# Patient Record
Sex: Female | Born: 1950 | Race: White | Hispanic: No | Smoking: Former smoker
Health system: Southern US, Community
[De-identification: ages and names within clinical notes are randomized; demographics above are authoritative.]

## PROBLEM LIST (undated history)

## (undated) DIAGNOSIS — M722 Plantar fascial fibromatosis: Secondary | ICD-10-CM

## (undated) DIAGNOSIS — M1990 Unspecified osteoarthritis, unspecified site: Secondary | ICD-10-CM

## (undated) DIAGNOSIS — E785 Hyperlipidemia, unspecified: Secondary | ICD-10-CM

## (undated) DIAGNOSIS — N200 Calculus of kidney: Secondary | ICD-10-CM

## (undated) DIAGNOSIS — I10 Essential (primary) hypertension: Secondary | ICD-10-CM

## (undated) DIAGNOSIS — T7840XA Allergy, unspecified, initial encounter: Secondary | ICD-10-CM

## (undated) HISTORY — PX: MOLE REMOVAL: SHX2046

## (undated) HISTORY — DX: Hyperlipidemia, unspecified: E78.5

## (undated) HISTORY — DX: Essential (primary) hypertension: I10

## (undated) HISTORY — DX: Calculus of kidney: N20.0

## (undated) HISTORY — PX: TONSILLECTOMY: SUR1361

## (undated) HISTORY — DX: Plantar fascial fibromatosis: M72.2

## (undated) HISTORY — DX: Unspecified osteoarthritis, unspecified site: M19.90

## (undated) HISTORY — PX: BREAST CYST ASPIRATION: SHX578

## (undated) HISTORY — DX: Allergy, unspecified, initial encounter: T78.40XA

## (undated) HISTORY — PX: CHOLECYSTECTOMY: SHX55

---

## 2015-03-11 ENCOUNTER — Encounter: Payer: Self-pay | Admitting: Pediatrics

## 2016-08-09 DIAGNOSIS — Z23 Encounter for immunization: Secondary | ICD-10-CM | POA: Diagnosis not present

## 2016-10-05 ENCOUNTER — Encounter: Payer: Self-pay | Admitting: Pediatrics

## 2016-10-05 ENCOUNTER — Ambulatory Visit (INDEPENDENT_AMBULATORY_CARE_PROVIDER_SITE_OTHER): Payer: Medicare Other | Admitting: Pediatrics

## 2016-10-05 VITALS — BP 159/86 | HR 71 | Temp 97.3°F | Ht 66.0 in | Wt 189.4 lb

## 2016-10-05 DIAGNOSIS — R739 Hyperglycemia, unspecified: Secondary | ICD-10-CM | POA: Diagnosis not present

## 2016-10-05 DIAGNOSIS — Z1211 Encounter for screening for malignant neoplasm of colon: Secondary | ICD-10-CM | POA: Diagnosis not present

## 2016-10-05 DIAGNOSIS — Z23 Encounter for immunization: Secondary | ICD-10-CM

## 2016-10-05 DIAGNOSIS — R030 Elevated blood-pressure reading, without diagnosis of hypertension: Secondary | ICD-10-CM | POA: Diagnosis not present

## 2016-10-05 DIAGNOSIS — E785 Hyperlipidemia, unspecified: Secondary | ICD-10-CM

## 2016-10-05 DIAGNOSIS — Z1159 Encounter for screening for other viral diseases: Secondary | ICD-10-CM

## 2016-10-05 DIAGNOSIS — Z1231 Encounter for screening mammogram for malignant neoplasm of breast: Secondary | ICD-10-CM

## 2016-10-05 DIAGNOSIS — Z1239 Encounter for other screening for malignant neoplasm of breast: Secondary | ICD-10-CM

## 2016-10-05 LAB — BAYER DCA HB A1C WAIVED: HB A1C (BAYER DCA - WAIVED): 5.8 % (ref <7.0)

## 2016-10-05 NOTE — Progress Notes (Signed)
Subjective:   Patient ID: Anita Kennedy, female    DOB: 31-Jan-1951, 65 y.o.   MRN: [id number] CC: New Patient (Initial Visit) follow up multiple med problems HPI: Anita Kennedy is a 65 y.o. female presenting for New Patient (Initial Visit)  Recently moved here from [place] Has children in [facility] and still in [place] No acute complaints today  Colonoscopy: about ten years ago, due Mammogram, last was normal over two years ago, told she had dense breast tissue, due Pap smear, no abnormals, was regularly getting them down  BP: says usually blood pressure at home in 120s-130s, always higher when in doctors offices  Past Medical History:  Diagnosis Date  . Arthritis   . Kidney stones   . Plantar fasciitis of right foot    Family History  Problem Relation Age of Onset  . Alcohol abuse Father   . Early death Father   . Heart disease Father   . Hyperlipidemia Father   . Hypertension Father   . Cancer Maternal Grandmother   . Depression Maternal Grandmother    Social History   Social History  . Marital status: Divorced    Spouse name: N/A  . Number of children: N/A  . Years of education: N/A   Social History Main Topics  . Smoking status: Former Smoker    Types: Cigarettes    Quit date: 10/05/2013  . Smokeless tobacco: Never Used  . Alcohol use No  . Drug use: No  . Sexual activity: Not Asked   Other Topics Concern  . None   Social History Narrative  . None   ROS: All systems negative other than what is in HPI  Objective:    BP (!) 159/86   Pulse 71   Temp 97.3 F (36.3 C) (Oral)   Ht '5\' 6"'  (1.676 m)   Wt 189 lb 6.4 oz (85.9 kg)   BMI 30.57 kg/m   Wt Readings from Last 3 Encounters:  10/05/16 189 lb 6.4 oz (85.9 kg)    Gen: NAD, alert, cooperative with exam, NCAT EYES: EOMI, no conjunctival injection, or no icterus ENT:  TMs pearly gray b/l, OP without erythema LYMPH: no cervical LAD CV: NRRR, normal S1/S2, no murmur, distal pulses 2+ b/l Resp: CTABL, no  wheezes, normal WOB Abd: +BS, soft, NTND. no guarding or organomegaly Ext: No edema, warm Neuro: Alert and oriented MSK: normal muscle bulk  Assessment & Plan:  [name] was seen today for new patient (initial visit), follow up multiple med problems.  Diagnoses and all orders for this visit:  Breast cancer screening -     MM Digital Screening; Future  Elevated blood pressure reading Check at home Bring numbers back to clinic, let me know if regularly over 140/90 -     CMP14+EGFR  Hyperlipidemia, unspecified hyperlipidemia type Not on statin now ASCVD 10 yr risk just over 96% with systolic in clinic, just over 7.5% with SBP of 131 pt says she had at home Does not want to start statin now Will check BPs, let me know if regularly elevated, will start medication -     Lipid panel  Hyperglycemia Last year A1c 5.7 -     Bayer DCA Hb A1c Waived  Need for hepatitis C screening test -     Hepatitis C antibody  Screen for colon cancer Last colonoscopy 10 yrs ago in [place], normal, due -     Ambulatory referral to Gastroenterology  Need for shingles vaccine -  Varicella-zoster vaccine subcutaneous  Need for prophylactic vaccination against Streptococcus pneumoniae (pneumococcus) -     Pneumococcal conjugate vaccine 13-valent  UTD Tdap, last 2011 Follow up plan: Return in about 1 year (around 10/05/2017). [name], MD [place]

## 2016-10-05 NOTE — Patient Instructions (Signed)
[facility] Appointment: [phone]  

## 2016-10-06 LAB — LIPID PANEL
CHOLESTEROL TOTAL: 217 mg/dL — AB (ref 100–199)
Chol/HDL Ratio: 3.6 ratio units (ref 0.0–4.4)
HDL: 61 mg/dL (ref >39)
LDL Calculated: 134 mg/dL — ABNORMAL HIGH (ref 0–99)
Triglycerides: 112 mg/dL (ref 0–149)
VLDL CHOLESTEROL CAL: 22 mg/dL (ref 5–40)

## 2016-10-06 LAB — HEPATITIS C ANTIBODY: Hep C Virus Ab: <0.1 s/co ratio (ref 0.0–0.9)

## 2016-10-06 LAB — CMP14+EGFR
ALK PHOS: 101 IU/L (ref 39–117)
ALT: 19 IU/L (ref 0–32)
AST: 17 IU/L (ref 0–40)
Albumin/Globulin Ratio: 1.7 (ref 1.2–2.2)
Albumin: 4.4 g/dL (ref 3.6–4.8)
BILIRUBIN TOTAL: 0.4 mg/dL (ref 0.0–1.2)
BUN/Creatinine Ratio: 28 (ref 12–28)
BUN: 21 mg/dL (ref 8–27)
CHLORIDE: 102 mmol/L (ref 96–106)
CO2: 23 mmol/L (ref 18–29)
Calcium: 9.4 mg/dL (ref 8.7–10.3)
Creatinine, Ser: 0.76 mg/dL (ref 0.57–1.00)
GFR calc Af Amer: 95 mL/min/{1.73_m2} (ref >59)
GFR calc non Af Amer: 82 mL/min/{1.73_m2} (ref >59)
GLUCOSE: 92 mg/dL (ref 65–99)
Globulin, Total: 2.6 g/dL (ref 1.5–4.5)
Potassium: 4.7 mmol/L (ref 3.5–5.2)
Sodium: 142 mmol/L (ref 134–144)
Total Protein: 7 g/dL (ref 6.0–8.5)

## 2016-10-20 ENCOUNTER — Ambulatory Visit
Admission: RE | Admit: 2016-10-20 | Discharge: 2016-10-20 | Disposition: A | Discharge status: Home / Self Care | Payer: Medicare Other | Source: Ambulatory Visit | Attending: Pediatrics | Admitting: Pediatrics

## 2016-10-20 DIAGNOSIS — Z1231 Encounter for screening mammogram for malignant neoplasm of breast: Secondary | ICD-10-CM | POA: Diagnosis not present

## 2016-10-20 DIAGNOSIS — Z1239 Encounter for other screening for malignant neoplasm of breast: Secondary | ICD-10-CM

## 2016-10-23 ENCOUNTER — Telehealth: Payer: Self-pay

## 2016-10-23 NOTE — Telephone Encounter (Signed)
Pt received triage letter from [facility]. Please call [phone]

## 2016-10-23 NOTE — Telephone Encounter (Signed)
See separate triage.  

## 2016-10-24 NOTE — Telephone Encounter (Signed)
Gastroenterology Pre-Procedure Review  Request Date: 10/23/2016 Requesting Physician: [name]  PATIENT REVIEW QUESTIONS: The patient responded to the following health history questions as indicated:    1. Diabetes Melitis: no 2. Joint replacements in the past 12 months: no 3. Major health problems in the past 3 months: no 4. Has an artificial valve or MVP: no 5. Has a defibrillator: no 6. Has been advised in past to take antibiotics in advance of a procedure like teeth cleaning: no 7. Family history of colon cancer: no  8. Alcohol Use: no 9. History of sleep apnea: no  10. History of coronary artery or other vascular stents placed within the last 12 months: no    MEDICATIONS & ALLERGIES:    Patient reports the following regarding taking any blood thinners:   Plavix? no Aspirin? no Coumadin? no Brilinta? no Xarelto? no Eliquis? no Pradaxa? no Savaysa? no Effient? no  Patient confirms/reports the following medications:  Current Outpatient Prescriptions  Medication Sig Dispense Refill  . clindamycin (CLEOCIN) 150 MG capsule      No current facility-administered medications for this visit.     Patient confirms/reports the following allergies:  Allergies  Allergen Reactions  . Pineapple Anaphylaxis  . Penicillins Hives  . Nickel Rash    No orders of the defined types were placed in this encounter.   AUTHORIZATION INFORMATION Primary Insurance:   ID #:  Group #:  Pre-Cert / Auth required: Pre-Cert / Auth #:   Secondary Insurance:   ID #:  Group #:  Pre-Cert / Auth required: Pre-Cert / Auth #:   SCHEDULE INFORMATION: Procedure has been scheduled as follows:  Date:  11/03/2016              Time:1:00 PM   Location: [facility] Short Stay  This Gastroenterology Pre-Precedure Review Form is being routed to the following provider(s): Barney Drain, MD

## 2016-10-26 ENCOUNTER — Other Ambulatory Visit: Payer: Self-pay

## 2016-10-26 DIAGNOSIS — Z1211 Encounter for screening for malignant neoplasm of colon: Secondary | ICD-10-CM

## 2016-10-26 MED ORDER — PEG 3350-KCL-NA BICARB-NACL 420 G PO SOLR: 4000.0000 mL | ORAL | 0 refills | Status: DC

## 2016-10-26 NOTE — Telephone Encounter (Signed)
MOVI PREP SPLIT DOSING, REGULAR BREAKFAST. CLEAR LIQUIDS AFTER 9 AM.  

## 2016-10-26 NOTE — Telephone Encounter (Signed)
Rx sent to the pharmacy and instructions faxed to pharmacy. LMOM for pt that the instructions were faxed to the pharmacy due to the holidays.

## 2016-10-27 NOTE — Telephone Encounter (Signed)
Left another VM that instructions were faxed to the pharmacy.

## 2016-11-03 ENCOUNTER — Ambulatory Visit (HOSPITAL_COMMUNITY)
Admission: RE | Admit: 2016-11-03 | Discharge: 2016-11-03 | Disposition: A | Discharge status: Home / Self Care | Payer: Medicare Other | Source: Ambulatory Visit | Attending: Gastroenterology | Admitting: Gastroenterology

## 2016-11-03 ENCOUNTER — Encounter (HOSPITAL_COMMUNITY): Payer: Self-pay | Admitting: *Deleted

## 2016-11-03 ENCOUNTER — Encounter (HOSPITAL_COMMUNITY)
Admission: RE | Disposition: A | Discharge status: Home / Self Care | Payer: Self-pay | Source: Ambulatory Visit | Attending: Gastroenterology

## 2016-11-03 DIAGNOSIS — Z1211 Encounter for screening for malignant neoplasm of colon: Secondary | ICD-10-CM

## 2016-11-03 DIAGNOSIS — Z1212 Encounter for screening for malignant neoplasm of rectum: Secondary | ICD-10-CM | POA: Diagnosis not present

## 2016-11-03 DIAGNOSIS — K648 Other hemorrhoids: Secondary | ICD-10-CM | POA: Diagnosis not present

## 2016-11-03 DIAGNOSIS — D123 Benign neoplasm of transverse colon: Secondary | ICD-10-CM

## 2016-11-03 DIAGNOSIS — Z87891 Personal history of nicotine dependence: Secondary | ICD-10-CM | POA: Diagnosis not present

## 2016-11-03 DIAGNOSIS — Z880 Allergy status to penicillin: Secondary | ICD-10-CM | POA: Diagnosis not present

## 2016-11-03 DIAGNOSIS — Z91018 Allergy to other foods: Secondary | ICD-10-CM | POA: Diagnosis not present

## 2016-11-03 DIAGNOSIS — Z888 Allergy status to other drugs, medicaments and biological substances status: Secondary | ICD-10-CM | POA: Insufficient documentation

## 2016-11-03 DIAGNOSIS — Q438 Other specified congenital malformations of intestine: Secondary | ICD-10-CM | POA: Insufficient documentation

## 2016-11-03 DIAGNOSIS — Z87442 Personal history of urinary calculi: Secondary | ICD-10-CM | POA: Insufficient documentation

## 2016-11-03 DIAGNOSIS — M1990 Unspecified osteoarthritis, unspecified site: Secondary | ICD-10-CM | POA: Insufficient documentation

## 2016-11-03 HISTORY — PX: COLONOSCOPY: SHX5424

## 2016-11-03 SURGERY — COLONOSCOPY
Anesthesia: Moderate Sedation

## 2016-11-03 MED ORDER — MEPERIDINE HCL 100 MG/ML IJ SOLN
INTRAMUSCULAR | Status: DC | PRN
  Administered 2016-11-03 (×2): 25 mg via INTRAVENOUS
  Administered 2016-11-03: 50 mg via INTRAVENOUS

## 2016-11-03 MED ORDER — ATROPINE SULFATE 1 MG/ML IJ SOLN
INTRAMUSCULAR | Status: AC
  Filled 2016-11-03: qty 1

## 2016-11-03 MED ORDER — SODIUM CHLORIDE 0.9 % IV SOLN
INTRAVENOUS | Status: DC
  Administered 2016-11-03: 13:00:00 via INTRAVENOUS

## 2016-11-03 MED ORDER — MIDAZOLAM HCL 5 MG/5ML IJ SOLN
INTRAMUSCULAR | Status: AC
  Filled 2016-11-03: qty 10

## 2016-11-03 MED ORDER — MEPERIDINE HCL 100 MG/ML IJ SOLN
INTRAMUSCULAR | Status: AC
  Filled 2016-11-03: qty 2

## 2016-11-03 MED ORDER — MIDAZOLAM HCL 5 MG/5ML IJ SOLN
INTRAMUSCULAR | Status: DC | PRN
  Administered 2016-11-03: 2 mg via INTRAVENOUS
  Administered 2016-11-03: 1 mg via INTRAVENOUS
  Administered 2016-11-03: 2 mg via INTRAVENOUS

## 2016-11-03 MED ORDER — ATROPINE SULFATE 1 MG/ML IJ SOLN
INTRAMUSCULAR | Status: DC | PRN
  Administered 2016-11-03: .5 mg via INTRAVENOUS

## 2016-11-03 NOTE — Discharge Instructions (Signed)
You had 2 polyps removed. You have internal hemorrhoids.   CONTINUE YOUR WEIGHT LOSS EFFORTS. LOSE TEN POUNDS.  DRINK WATER TO KEEP YOUR URINE LIGHT YELLOW.  FOLLOW A HIGH FIBER DIET. AVOID ITEMS THAT CAUSE BLOATING & GAS. SEE INFO BELOW.  YOUR BIOPSY RESULTS WILL BE AVAILABLE IN MY CHART AFTER JAN 4 AND MY OFFICE WILL CONTACT YOU IN 10-14 DAYS WITH YOUR RESULTS.   Next colonoscopy in 5-10 years.    Colonoscopy Care After Read the instructions outlined below and refer to this sheet in the next week. These discharge instructions provide you with general information on caring for yourself after you leave the hospital. While your treatment has been planned according to the most current medical practices available, unavoidable complications occasionally occur. If you have any problems or questions after discharge, call DR. Joquan Lotz, [phone].  ACTIVITY  You may resume your regular activity, but move at a slower pace for the next 24 hours.   Take frequent rest periods for the next 24 hours.   Walking will help get rid of the air and reduce the bloated feeling in your belly (abdomen).   No driving for 24 hours (because of the medicine (anesthesia) used during the test).   You may shower.   Do not sign any important legal documents or operate any machinery for 24 hours (because of the anesthesia used during the test).    NUTRITION  Drink plenty of fluids.   You may resume your normal diet as instructed by your doctor.   Begin with a light meal and progress to your normal diet. Heavy or fried foods are harder to digest and may make you feel sick to your stomach (nauseated).   Avoid alcoholic beverages for 24 hours or as instructed.    MEDICATIONS  You may resume your normal medications.   WHAT YOU CAN EXPECT TODAY  Some feelings of bloating in the abdomen.   Passage of more gas than usual.   Spotting of blood in your stool or on the toilet paper  .  IF YOU HAD  POLYPS REMOVED DURING THE COLONOSCOPY:  Eat a soft diet IF YOU HAVE NAUSEA, BLOATING, ABDOMINAL PAIN, OR VOMITING.    FINDING OUT THE RESULTS OF YOUR TEST Not all test results are available during your visit. DR. Oneida Alar WILL CALL YOU WITHIN 14 DAYS OF YOUR PROCEDUE WITH YOUR RESULTS. Do not assume everything is normal if you have not heard from DR. Shanoah Asbill, CALL HER OFFICE AT [phone].  SEEK IMMEDIATE MEDICAL ATTENTION AND CALL THE OFFICE: [phone] IF:  You have more than a spotting of blood in your stool.   Your belly is swollen (abdominal distention).   You are nauseated or vomiting.   You have a temperature over 101F.   You have abdominal pain or discomfort that is severe or gets worse throughout the day.   High-Fiber Diet A high-fiber diet changes your normal diet to include more whole grains, legumes, fruits, and vegetables. Changes in the diet involve replacing refined carbohydrates with unrefined foods. The calorie level of the diet is essentially unchanged. The Dietary Reference Intake (recommended amount) for adult males is 38 grams per day. For adult females, it is 25 grams per day. Pregnant and lactating women should consume 28 grams of fiber per day. Fiber is the intact part of a plant that is not broken down during digestion. Functional fiber is fiber that has been isolated from the plant to provide a beneficial effect in the body.  PURPOSE  Increase stool bulk.   Ease and regulate bowel movements.   Lower cholesterol.   REDUCE RISK OF COLON CANCER  INDICATIONS THAT YOU NEED MORE FIBER  Constipation and hemorrhoids.   Uncomplicated diverticulosis (intestine condition) and irritable bowel syndrome.   Weight management.   As a protective measure against hardening of the arteries (atherosclerosis), diabetes, and cancer.   GUIDELINES FOR INCREASING FIBER IN THE DIET  Start adding fiber to the diet slowly. A gradual increase of about 5 more grams (2 slices  of whole-wheat bread, 2 servings of most fruits or vegetables, or 1 bowl of high-fiber cereal) per day is best. Too rapid an increase in fiber may result in constipation, flatulence, and bloating.   Drink enough water and fluids to keep your urine clear or pale yellow. Water, juice, or caffeine-free drinks are recommended. Not drinking enough fluid may cause constipation.   Eat a variety of high-fiber foods rather than one type of fiber.   Try to increase your intake of fiber through using high-fiber foods rather than fiber pills or supplements that contain small amounts of fiber.   The goal is to change the types of food eaten. Do not supplement your present diet with high-fiber foods, but replace foods in your present diet.   INCLUDE A VARIETY OF FIBER SOURCES  Replace refined and processed grains with whole grains, canned fruits with fresh fruits, and incorporate other fiber sources. White rice, white breads, and most bakery goods contain little or no fiber.   Brown whole-grain rice, buckwheat oats, and many fruits and vegetables are all good sources of fiber. These include: broccoli, Brussels sprouts, cabbage, cauliflower, beets, sweet potatoes, white potatoes (skin on), carrots, tomatoes, eggplant, squash, berries, fresh fruits, and dried fruits.   Cereals appear to be the richest source of fiber. Cereal fiber is found in whole grains and bran. Bran is the fiber-rich outer coat of cereal grain, which is largely removed in refining. In whole-grain cereals, the bran remains. In breakfast cereals, the largest amount of fiber is found in those with "bran" in their names. The fiber content is sometimes indicated on the label.   You may need to include additional fruits and vegetables each day.   In baking, for 1 cup white flour, you may use the following substitutions:   1 cup whole-wheat flour minus 2 tablespoons.   1/2 cup white flour plus 1/2 cup whole-wheat flour.   Polyps, Colon  A  polyp is extra tissue that grows inside your body. Colon polyps grow in the large intestine. The large intestine, also called the colon, is part of your digestive system. It is a long, hollow tube at the end of your digestive tract where your body makes and stores stool. Most polyps are not dangerous. They are benign. This means they are not cancerous. But over time, some types of polyps can turn into cancer. Polyps that are smaller than a pea are usually not harmful. But larger polyps could someday become or may already be cancerous. To be safe, doctors remove all polyps and test them.    PREVENTION There is not one sure way to prevent polyps. You might be able to lower your risk of getting them if you:  Eat more fruits and vegetables and less fatty food.   Do not smoke.   Avoid alcohol.   Exercise every day.   Lose weight if you are overweight.   Eating more calcium and folate can also lower your risk  of getting polyps. Some foods that are rich in calcium are milk, cheese, and broccoli. Some foods that are rich in folate are chickpeas, kidney beans, and spinach.   Hemorrhoids Hemorrhoids are dilated (enlarged) veins around the rectum. Sometimes clots will form in the veins. This makes them swollen and painful. These are called thrombosed hemorrhoids. Causes of hemorrhoids include:  Constipation.   Straining to have a bowel movement.   HEAVY LIFTING  HOME CARE INSTRUCTIONS  Eat a well balanced diet and drink 6 to 8 glasses of water every day to avoid constipation. You may also use a bulk laxative.   Avoid straining to have bowel movements.   Keep anal area dry and clean.   Do not use a donut shaped pillow or sit on the toilet for long periods. This increases blood pooling and pain.   Move your bowels when your body has the urge; this will require less straining and will decrease pain and pressure.

## 2016-11-03 NOTE — H&P (Signed)
  Primary Care Physician:  Eustaquio Maize, MD Primary Gastroenterologist:  Dr. Oneida Alar  Pre-Procedure History & Physical: HPI:  Anita Kennedy is a 65 y.o. female here for [place].  Past Medical History:  Diagnosis Date  . Arthritis   . Kidney stones   . Plantar fasciitis of right foot     Past Surgical History:  Procedure Laterality Date  . CESAREAN SECTION    . CHOLECYSTECTOMY    . MOLE REMOVAL    . TONSILLECTOMY      Prior to Admission medications   Medication Sig Start Date End Date Taking? Authorizing Provider  azithromycin (ZITHROMAX) 250 MG tablet Take 250 mg by mouth daily. Pt started med 11/01/16.. For 5 days   Yes Historical Provider, MD  ibuprofen (ADVIL,MOTRIN) 200 MG tablet Take 200 mg by mouth every 6 (six) hours as needed.   Yes Historical Provider, MD  polyethylene glycol-electrolytes (TRILYTE) 420 g solution Take 4,000 mLs by mouth as directed. 10/26/16  Yes Danie Binder, MD  acetaminophen (TYLENOL) 650 MG CR tablet Take 1,300 mg by mouth every 8 (eight) hours as needed for pain.    Historical Provider, MD    Allergies as of 10/26/2016 - Review Complete 10/23/2016  Allergen Reaction Noted  . Pineapple Anaphylaxis 10/05/2016  . Penicillins Hives 10/05/2016  . Nickel Rash 10/05/2016    Family History  Problem Relation Age of Onset  . Alcohol abuse Father   . Early death Father   . Heart disease Father   . Hyperlipidemia Father   . Hypertension Father   . Cancer Maternal Grandmother   . Depression Maternal Grandmother   . Colon cancer Neg Hx     Social History   Social History  . Marital status: Divorced    Spouse name: N/A  . Number of children: N/A  . Years of education: N/A   Occupational History  . Not on file.   Social History Main Topics  . Smoking status: Former Smoker    Types: Cigarettes    Quit date: 10/05/2013  . Smokeless tobacco: Never Used  . Alcohol use No  . Drug use: No  . Sexual activity: Not on file    Other Topics Concern  . Not on file   Social History Narrative  . No narrative on file    Review of Systems: See HPI, otherwise negative ROS   Physical Exam: BP (!) 148/75   Pulse 79   Temp 98.3 F (36.8 C) (Oral)   Resp 14   Ht 5' 5.5" (1.664 m)   Wt 180 lb (81.6 kg)   SpO2 96%   BMI 29.50 kg/m  General:   Alert,  pleasant and cooperative in NAD Head:  Normocephalic and atraumatic. Neck:  Supple; Lungs:  Clear throughout to auscultation.    Heart:  Regular rate and rhythm. Abdomen:  Soft, nontender and nondistended. Normal bowel sounds, without guarding, and without rebound.   Neurologic:  Alert and  oriented x4;  grossly normal neurologically.  Impression/Plan:     SCREENING  Plan:  1. TCS TODAY. DISCUSSED PROCEDURE, BENEFITS, & RISKS: < 1% chance of medication reaction, bleeding, perforation, or rupture of spleen/liver.

## 2016-11-03 NOTE — Op Note (Signed)
[facility] Patient Name: Anita Kennedy Procedure Date: 11/03/2016 1:07 PM MRN: [id number] Date of Birth: [birth year] Attending MD: Barney Drain , MD CSN: [id number] Age: 65 Admit Type: Outpatient Procedure:                Colonoscopy with SNARE POLYPECTOMY Indications:              Screening for colorectal malignant neoplasm. LAST                            TCS 10 YRS AGO-NO POLYPS Providers:                Barney Drain, MD, Janeece Riggers, RN, Randa Spike,                            Technician Referring MD:             [name], MD Medicines:                Meperidine 100 mg IV, Midazolam 5 mg IV Complications:            No immediate complications. Estimated Blood Loss:     Estimated blood loss: none. Procedure:                Pre-Anesthesia Assessment:                           - Prior to the procedure, a History and Physical                            was performed, and patient medications and                            allergies were reviewed. The patient's tolerance of                            previous anesthesia was also reviewed. The risks                            and benefits of the procedure and the sedation                            options and risks were discussed with the patient.                            All questions were answered, and informed consent                            was obtained. Prior Anticoagulants: The patient has                            taken ibuprofen. ASA Grade Assessment: II - A                            patient with mild systemic disease. After reviewing  the risks and benefits, the patient was deemed in                            satisfactory condition to undergo the procedure.                            After obtaining informed consent, the colonoscope                            was passed under direct vision. Throughout the                            procedure, the patient's blood pressure, pulse,  and                            oxygen saturations were monitored continuously. The                            [id number]) was introduced through the anus                            and advanced to the the cecum, identified by                            appendiceal orifice and ileocecal valve. The                            ileocecal valve, appendiceal orifice, and rectum                            were photographed. The colonoscopy was technically                            difficult and complex due to significant looping                            and the patient's cardiovascular instability                            (vasovagal reaction). Successful completion of the                            procedure was aided by increasing the dose of                            sedation medication and COLOWRAP. The patient                            tolerated the procedure fairly well. The quality of                            the bowel preparation was excellent. Scope In: 1:34:25 PM Scope Out: 1:59:46 PM Scope Withdrawal Time: 0 hours 17 minutes 12 seconds  Total Procedure Duration:  0 hours 25 minutes 21 seconds  Findings:      Two sessile polyps were found in the hepatic flexure. The polyps were 5       to 7 mm in size. These polyps were removed with a hot snare. Resection       and retrieval were complete.      The recto-sigmoid colon, sigmoid colon and descending colon were       significantly redundant.      Internal hemorrhoids were found. The hemorrhoids were small. Impression:               - Two 5 to 7 mm polyps at the hepatic flexure,                            removed with a hot snare. Resected and retrieved.                           - Redundant LEFT colon.                           - Internal hemorrhoids. Moderate Sedation:      Moderate (conscious) sedation was administered by the endoscopy nurse       and supervised by the endoscopist. The following parameters were        monitored: oxygen saturation, heart rate, blood pressure, and response       to care. Total physician intraservice time was 42 minutes. Recommendation:           - High fiber diet.                           - Continue present medications.                           - Await pathology results.                           - Repeat colonoscopy in 5-10 years for surveillance.                           - Patient has a contact number available for                            emergencies. The signs and symptoms of potential                            delayed complications were discussed with the                            patient. Return to normal activities tomorrow.                            Written discharge instructions were provided to the                            patient. Procedure Code(s):        --- Professional ---  [phone], Colonoscopy, flexible; with removal of                            tumor(s), polyp(s), or other lesion(s) by snare                            technique                           99152, Moderate sedation services provided by the                            same physician or other qualified health care                            professional performing the diagnostic or                            therapeutic service that the sedation supports,                            requiring the presence of an independent trained                            observer to assist in the monitoring of the                            patient's level of consciousness and physiological                            status; initial 15 minutes of intraservice time,                            patient age 51 years or older                           [phone], Moderate sedation services; each additional                            15 minutes intraservice time                           99153, Moderate sedation services; each additional                            15 minutes intraservice  time Diagnosis Code(s):        --- Professional ---                           Z12.11, Encounter for screening for malignant                            neoplasm of colon                           D12.3, Benign neoplasm of  transverse colon (hepatic                            flexure or splenic flexure)                           K64.8, Other hemorrhoids                           Q43.8, Other specified congenital malformations of                            intestine CPT copyright 2016 [employer]. All rights reserved. The codes documented in this report are preliminary and upon coder review may  be revised to meet current compliance requirements. Barney Drain, MD Barney Drain, MD 11/03/2016 2:18:11 PM This report has been signed electronically. Number of Addenda: 0

## 2016-11-07 ENCOUNTER — Encounter (HOSPITAL_COMMUNITY): Payer: Self-pay | Admitting: Gastroenterology

## 2016-11-08 ENCOUNTER — Telehealth: Payer: Self-pay | Admitting: Gastroenterology

## 2016-11-08 NOTE — Telephone Encounter (Signed)
Please call pt. 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   CONTINUE YOUR WEIGHT LOSS EFFORTS. LOSE TEN POUNDS.  DRINK WATER TO KEEP YOUR URINE LIGHT YELLOW.  FOLLOW A HIGH FIBER DIET. AVOID ITEMS THAT CAUSE BLOATING & GAS.   Next colonoscopy in 5-10 years.

## 2016-11-09 NOTE — Telephone Encounter (Signed)
Reminder in epic °

## 2016-11-09 NOTE — Telephone Encounter (Signed)
Pt is aware results  

## 2017-03-14 DIAGNOSIS — D485 Neoplasm of uncertain behavior of skin: Secondary | ICD-10-CM | POA: Diagnosis not present

## 2017-03-23 DIAGNOSIS — D229 Melanocytic nevi, unspecified: Secondary | ICD-10-CM | POA: Diagnosis not present

## 2017-03-23 DIAGNOSIS — L570 Actinic keratosis: Secondary | ICD-10-CM | POA: Diagnosis not present

## 2017-03-28 DIAGNOSIS — L570 Actinic keratosis: Secondary | ICD-10-CM | POA: Diagnosis not present

## 2017-08-01 ENCOUNTER — Ambulatory Visit (INDEPENDENT_AMBULATORY_CARE_PROVIDER_SITE_OTHER): Payer: Medicare Other

## 2017-08-01 DIAGNOSIS — Z23 Encounter for immunization: Secondary | ICD-10-CM | POA: Diagnosis not present

## 2017-08-13 ENCOUNTER — Ambulatory Visit (INDEPENDENT_AMBULATORY_CARE_PROVIDER_SITE_OTHER): Payer: Medicare Other

## 2017-08-13 ENCOUNTER — Encounter: Payer: Self-pay | Admitting: *Deleted

## 2017-08-13 ENCOUNTER — Ambulatory Visit (INDEPENDENT_AMBULATORY_CARE_PROVIDER_SITE_OTHER): Payer: Medicare Other | Admitting: *Deleted

## 2017-08-13 VITALS — BP 148/82 | HR 70 | Ht 64.0 in | Wt 197.0 lb

## 2017-08-13 DIAGNOSIS — Z780 Asymptomatic menopausal state: Secondary | ICD-10-CM

## 2017-08-13 DIAGNOSIS — Z0000 Encounter for general adult medical examination without abnormal findings: Secondary | ICD-10-CM | POA: Diagnosis not present

## 2017-08-13 NOTE — Patient Instructions (Addendum)
Keep up the healthy diet and exercise habits you have been working on.  Consider volunteering 1-2 days per week at an organization of your choice  Review Advanced Directives information given, if you decide to put these in place please bring our office a copy to file in your chart.  You had your bone density test done today.     Thank you for coming in for your annual wellness visit today!     Preventive Care 12 Years and Older, Female Preventive care refers to lifestyle choices and visits with your health care provider that can promote health and wellness. What does preventive care include?  A yearly physical exam. This is also called an annual well check.  Dental exams once or twice a year.  Routine eye exams. Ask your health care provider how often you should have your eyes checked.  Personal lifestyle choices, including: ? Daily care of your teeth and gums. ? Regular physical activity. ? Eating a healthy diet. ? Avoiding tobacco and drug use. ? Limiting alcohol use. ? Practicing safe sex. ? Taking low-dose aspirin every day. ? Taking vitamin and mineral supplements as recommended by your health care provider. What happens during an annual well check? The services and screenings done by your health care provider during your annual well check will depend on your age, overall health, lifestyle risk factors, and family history of disease. Counseling Your health care provider may ask you questions about your:  Alcohol use.  Tobacco use.  Drug use.  Emotional well-being.  Home and relationship well-being.  Sexual activity.  Eating habits.  History of falls.  Memory and ability to understand (cognition).  Work and work Statistician.  Reproductive health.  Screening You may have the following tests or measurements:  Height, weight, and BMI.  Blood pressure.  Lipid and cholesterol levels. These may be checked every 5 years, or more frequently if you are  over 79 years old.  Skin check.  Lung cancer screening. You may have this screening every year starting at age 10 if you have a 30-pack-year history of smoking and currently smoke or have quit within the past 15 years.  Fecal occult blood test (FOBT) of the stool. You may have this test every year starting at age 43.  Flexible sigmoidoscopy or colonoscopy. You may have a sigmoidoscopy every 5 years or a colonoscopy every 10 years starting at age 30.  Hepatitis C blood test.  Hepatitis B blood test.  Sexually transmitted disease (STD) testing.  Diabetes screening. This is done by checking your blood sugar (glucose) after you have not eaten for a while (fasting). You may have this done every 1-3 years.  Bone density scan. This is done to screen for osteoporosis. You may have this done starting at age 44.  Mammogram. This may be done every 1-2 years. Talk to your health care provider about how often you should have regular mammograms.  Talk with your health care provider about your test results, treatment options, and if necessary, the need for more tests. Vaccines Your health care provider may recommend certain vaccines, such as:  Influenza vaccine. This is recommended every year.  Tetanus, diphtheria, and acellular pertussis (Tdap, Td) vaccine. You may need a Td booster every 10 years.  Varicella vaccine. You may need this if you have not been vaccinated.  Zoster vaccine. You may need this after age 24.  Measles, mumps, and rubella (MMR) vaccine. You may need at least one dose of  MMR if you were born in 1957 or later. You may also need a second dose.  Pneumococcal 13-valent conjugate (PCV13) vaccine. One dose is recommended after age 58.  Pneumococcal polysaccharide (PPSV23) vaccine. One dose is recommended after age 12.  Meningococcal vaccine. You may need this if you have certain conditions.  Hepatitis A vaccine. You may need this if you have certain conditions or if you  travel or work in places where you may be exposed to hepatitis A.  Hepatitis B vaccine. You may need this if you have certain conditions or if you travel or work in places where you may be exposed to hepatitis B.  Haemophilus influenzae type b (Hib) vaccine. You may need this if you have certain conditions.  Talk to your health care provider about which screenings and vaccines you need and how often you need them. This information is not intended to replace advice given to you by your health care provider. Make sure you discuss any questions you have with your health care provider. Document Released: 11/19/2015 Document Revised: 07/12/2016 Document Reviewed: 08/24/2015 Elsevier Interactive Patient Education  2017 [employer].

## 2017-08-13 NOTE — Progress Notes (Signed)
Subjective:   PESSY DELAMAR is a 66 y.o. female who presents for an Initial Medicare Annual Wellness Visit.  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  She bought her home here and had been spending the summers remodeling it for a few years before she moved here.  She enjoys walking for exercise and crocheting.  She has 6 children that live in [place] and the [place].  Ms. Boss has a significant other who lives at [facility].  She visits him 1 week out of each month.  She feels her health is about the same as it was last year.  She has had no hospitalizations, emergency room visits, or surgeries this year.    Review of Systems    All negative today.         Objective:    Today's Vitals   08/13/17 0857  BP: (!) 148/82  Pulse: 70  Weight: 197 lb (89.4 kg)  Height: 5\' 4"  (1.626 m)  PainSc: 0-No pain   Body mass index is 33.81 kg/m.   Current Medications (verified) Outpatient Encounter Prescriptions as of 08/13/2017  Medication Sig  . ibuprofen (ADVIL,MOTRIN) 200 MG tablet Take 200 mg by mouth every 6 (six) hours as needed.  Marland Kitchen acetaminophen (TYLENOL) 650 MG CR tablet Take 1,300 mg by mouth every 8 (eight) hours as needed for pain.  . [DISCONTINUED] azithromycin (ZITHROMAX) 250 MG tablet Take 250 mg by mouth daily. Pt started med 11/01/16.. For 5 days   No facility-administered encounter medications on file as of 08/13/2017.     Allergies (verified) Pineapple; Penicillins; and Nickel   History: Past Medical History:  Diagnosis Date  . Arthritis   . Kidney stones   . Plantar fasciitis of right foot    Past Surgical History:  Procedure Laterality Date  . CESAREAN SECTION    . CHOLECYSTECTOMY    . COLONOSCOPY N/A 11/03/2016   Procedure: COLONOSCOPY;  Surgeon: Danie Binder, MD;  [facility];  Service: Endoscopy;  Laterality: N/A;  1;00 pm  . MOLE REMOVAL     . TONSILLECTOMY     Family History  Problem Relation Age of Onset  . Alcohol abuse Father   . Early death Father   . Heart disease Father   . Hyperlipidemia Father   . Hypertension Father   . Cancer Maternal Grandmother   . Depression Maternal Grandmother   . Colon cancer Neg Hx    Social History   Occupational History  . Not on file.   Social History Main Topics  . Smoking status: Former Smoker    Types: Cigarettes    Quit date: 10/05/2013  . Smokeless tobacco: Never Used  . Alcohol use No  . Drug use: No  . Sexual activity: Not on file    Tobacco Counseling Counseling given: No   Activities of Daily Living In your present state of health, do you have any difficulty performing the following activities: 08/13/2017  Hearing? N  Vision? N  Difficulty concentrating or making decisions? N  Walking or climbing stairs? N  Dressing or bathing? N  Doing errands, shopping? N  Some recent data might be hidden    Immunizations and Health Maintenance Immunization History  Administered Date(s) Administered  . Hepatitis B, adult 06/25/2009, 11/11/2009  . Influenza, High Dose Seasonal PF 08/01/2017  . Pneumococcal Conjugate-13 10/05/2016  . Tdap 01/31/2010  .  Zoster 10/05/2016   Health Maintenance Due  Topic Date Due  . DEXA SCAN  08/08/2015    Patient Care Team: Eustaquio Maize, MD as PCP - General (Pediatrics)      Assessment:   This is a routine wellness examination for [facility].   Hearing/Vision screen No hearing deficits noted  Wears glasses, and plans to schedule an appointment for November at [facility] in [place], [place].  She states this is a brand new office scheduled to open 08/17/17.  Dietary issues and exercise activities discussed: Current Exercise Habits: Home exercise routine, Type of exercise: walking;strength training/weights, Time (Minutes): 60, Frequency (Times/Week): 7, Weekly Exercise (Minutes/Week): 420, Intensity: Moderate,  Exercise limited by: None identified  Goals    . Volunteer (pt-stated)          Patient indicates that she wants to start volunteering 1-2 days per week at an organization of her choice.       Depression Screen PHQ 2/9 Scores 08/13/2017 10/05/2016  PHQ - 2 Score 0 0    Fall Risk Fall Risk  08/13/2017 10/05/2016  Falls in the past year? No No    Cognitive Function: MMSE - Mini Mental State Exam 08/13/2017  Orientation to time 5  Orientation to Place 5  Registration 3  Attention/ Calculation 5  Recall 2  Language- name 2 objects 2  Language- repeat 1  Language- follow 3 step command 3  Language- read & follow direction 1  Write a sentence 1  Copy design 1  Total score 29        Screening Tests Health Maintenance  Topic Date Due  . DEXA SCAN  08/08/2015  . PNA vac Low Risk Adult (2 of 2 - PPSV23) 10/05/2017  . MAMMOGRAM  10/20/2018  . TETANUS/TDAP  02/01/2020  . COLONOSCOPY  11/03/2026  . INFLUENZA VACCINE  Completed  . Hepatitis C Screening  Completed   Dexa Scan done today Recommend PPSV 23 at visit in December with Dr. Evette Doffing.      Plan:      Continue healthy diet and exercise habits you have been working on. Consider volunteering 1-2 days per week at an organization of your choice. Review Advanced Directives information given, if you decide to put these in place please bring our office a copy to file in your chart     I have personally reviewed and noted the following in the patient's chart:   . Medical and social history . Use of alcohol, tobacco or illicit drugs  . Current medications and supplements . Functional ability and status . Nutritional status . Physical activity . Advanced directives . List of other physicians . Hospitalizations, surgeries, and ER visits in previous 12 months . Vitals . Screenings to include cognitive, depression, and falls . Referrals and appointments  In addition, I have reviewed and discussed with patient  certain preventive protocols, quality metrics, and best practice recommendations. A written personalized care plan for preventive services as well as general preventive health recommendations were provided to patient.     Cayce Paschal M, RN   08/13/2017    I have reviewed and agree with the above AWV documentation.   [name], MD [place]

## 2017-09-18 ENCOUNTER — Other Ambulatory Visit: Payer: Self-pay | Admitting: Pediatrics

## 2017-09-18 DIAGNOSIS — Z1231 Encounter for screening mammogram for malignant neoplasm of breast: Secondary | ICD-10-CM

## 2017-10-08 ENCOUNTER — Ambulatory Visit (INDEPENDENT_AMBULATORY_CARE_PROVIDER_SITE_OTHER): Payer: Medicare Other | Admitting: Pediatrics

## 2017-10-08 ENCOUNTER — Encounter: Payer: Self-pay | Admitting: Pediatrics

## 2017-10-08 VITALS — BP 135/90 | HR 93 | Temp 97.6°F | Ht 64.0 in | Wt 195.8 lb

## 2017-10-08 DIAGNOSIS — E785 Hyperlipidemia, unspecified: Secondary | ICD-10-CM | POA: Diagnosis not present

## 2017-10-08 DIAGNOSIS — H65112 Acute and subacute allergic otitis media (mucoid) (sanguinous) (serous), left ear: Secondary | ICD-10-CM

## 2017-10-08 DIAGNOSIS — E669 Obesity, unspecified: Secondary | ICD-10-CM | POA: Diagnosis not present

## 2017-10-08 DIAGNOSIS — R7303 Prediabetes: Secondary | ICD-10-CM | POA: Diagnosis not present

## 2017-10-08 DIAGNOSIS — Z23 Encounter for immunization: Secondary | ICD-10-CM | POA: Diagnosis not present

## 2017-10-08 DIAGNOSIS — Z6833 Body mass index (BMI) 33.0-33.9, adult: Secondary | ICD-10-CM

## 2017-10-08 LAB — CMP14+EGFR
A/G RATIO: 1.6 (ref 1.2–2.2)
ALK PHOS: 103 IU/L (ref 39–117)
ALT: 25 IU/L (ref 0–32)
AST: 20 IU/L (ref 0–40)
Albumin: 4.5 g/dL (ref 3.6–4.8)
BILIRUBIN TOTAL: 0.3 mg/dL (ref 0.0–1.2)
BUN / CREAT RATIO: 18 (ref 12–28)
BUN: 15 mg/dL (ref 8–27)
CHLORIDE: 100 mmol/L (ref 96–106)
CO2: 23 mmol/L (ref 20–29)
Calcium: 9.8 mg/dL (ref 8.7–10.3)
Creatinine, Ser: 0.83 mg/dL (ref 0.57–1.00)
GFR calc non Af Amer: 73 mL/min/{1.73_m2} (ref >59)
GFR, EST AFRICAN AMERICAN: 84 mL/min/{1.73_m2} (ref >59)
GLUCOSE: 106 mg/dL — AB (ref 65–99)
Globulin, Total: 2.8 g/dL (ref 1.5–4.5)
POTASSIUM: 4.4 mmol/L (ref 3.5–5.2)
Sodium: 140 mmol/L (ref 134–144)
TOTAL PROTEIN: 7.3 g/dL (ref 6.0–8.5)

## 2017-10-08 LAB — LIPID PANEL
CHOLESTEROL TOTAL: 196 mg/dL (ref 100–199)
Chol/HDL Ratio: 3.6 ratio (ref 0.0–4.4)
HDL: 54 mg/dL (ref >39)
LDL Calculated: 117 mg/dL — ABNORMAL HIGH (ref 0–99)
TRIGLYCERIDES: 124 mg/dL (ref 0–149)
VLDL CHOLESTEROL CAL: 25 mg/dL (ref 5–40)

## 2017-10-08 LAB — BAYER DCA HB A1C WAIVED: HB A1C: 5.9 % (ref <7.0)

## 2017-10-08 MED ORDER — AZITHROMYCIN 250 MG PO TABS: ORAL_TABLET | ORAL | 0 refills | Status: DC

## 2017-10-08 NOTE — Patient Instructions (Signed)
Nasal congestion: Netipot with distilled water 2-3 times a day to clear out sinuses Or Normal saline nasal spray Flonase steroid nasal spray  Can take daily antihistamine such as Claritin or cetirizine  For sore throat can use: Throat lozenges chloroseptic spray  Stick with bland foods Drink lots of fluids

## 2017-10-08 NOTE — Progress Notes (Signed)
  Subjective:   Patient ID: Anita Kennedy, female    DOB: 1951/01/28, 66 y.o.   MRN: [id number] CC: Cough; and Chest Congestion  HPI: Anita Kennedy is a 66 y.o. female presenting for Cough; and Chest Congestion  BPs at home:  035-597 systolic, diastolics usually 41-63 No HA, chest pain, shortness of breath  Pre-diabetes: 106 first thing in the morning Wants to start walking more regularly, has a bicycle, total gym at home that she uses Staying active outside with yard work  Avoiding sweets, sodas Uses artificial sweetener coffee Eating lots of salads, avocado, cheese other favorite treats  For the last 3 days has had respiratory symptoms including slightly scratchy throat, has coughed a few times Not keeping her awake at night Some nasal drainage No fevers Appetite is normal No facial pressure or pain She was around some younger kids who had  runny noses about a week ago  Last Pap smear over 5 years ago Has been with current partner for the last 4 years No history of abnormal Pap smears Was regularly getting screened.  Has mammogram scheduled for later this month Colonoscopy last year, repeat in 5-10 years  Relevant past medical, surgical, family and social history reviewed. Allergies and medications reviewed and updated. Social History   Tobacco Use  Smoking Status Former Smoker  . Types: Cigarettes  . Last attempt to quit: 10/05/2013  . Years since quitting: 4.0  Smokeless Tobacco Never Used   ROS: All systems negative other than what is in the HPI  Objective:    BP 135/90   Pulse 93   Temp 97.6 F (36.4 C) (Oral)   Ht 5' 4" (1.626 m)   Wt 195 lb 12.8 oz (88.8 kg)   BMI 33.61 kg/m   Wt Readings from Last 3 Encounters:  10/08/17 195 lb 12.8 oz (88.8 kg)  08/13/17 197 lb (89.4 kg)  11/03/16 180 lb (81.6 kg)    Gen: NAD, alert, cooperative with exam, NCAT EYES: EOMI, no conjunctival injection, or no icterus ENT: Left TM red, with white effusion, right TM  obscured by cerumen, OP with mild erythema LYMPH: no cervical LAD CV: NRRR, normal S1/S2, no murmur, distal pulses 2+ b/l Resp: CTABL, no wheezes, normal WOB Abd: +BS, soft, NTND. no guarding or organomegaly Ext: No edema, warm Neuro: Alert and oriented, strength equal b/l UE and LE, coordination grossly normal MSK: normal muscle bulk  Assessment & Plan:  Anita Kennedy was seen today for annual exam, cough and chest congestion.  Diagnoses and all orders for this visit:  Hyperlipidemia, unspecified hyperlipidemia type Elevated last check Patient wants to avoid medications if possible She is taking daily omega-3 fatty acids We will repeat again today Still elevated patient wants to try changes and weight loss -     Lipid panel  Class 1 obesity with body mass index (BMI) of 33.0 to 33.9 in adult, unspecified obesity type, unspecified whether serious comorbidity present Discussed goal of 5-10 pounds over the next 3 months Patient planning to increase physical activity -     CMP14+EGFR  Pre-diabetes -     Bayer DCA Hb A1c Waived  Acute mucoid otitis media of left ear -     azithromycin (ZITHROMAX) 250 MG tablet; Take 2 the first day and then one each day after.  Due for pneumonia shot today  Follow up plan: Return in about 3 months (around 01/06/2018) for 3-6 month. [name], MD [place]

## 2017-10-24 ENCOUNTER — Ambulatory Visit
Admission: RE | Admit: 2017-10-24 | Discharge: 2017-10-24 | Disposition: A | Discharge status: Home / Self Care | Payer: Medicare Other | Source: Ambulatory Visit | Attending: Pediatrics | Admitting: Pediatrics

## 2017-10-24 DIAGNOSIS — Z1231 Encounter for screening mammogram for malignant neoplasm of breast: Secondary | ICD-10-CM

## 2017-12-13 ENCOUNTER — Ambulatory Visit (INDEPENDENT_AMBULATORY_CARE_PROVIDER_SITE_OTHER): Payer: Medicare Other

## 2017-12-13 ENCOUNTER — Ambulatory Visit (INDEPENDENT_AMBULATORY_CARE_PROVIDER_SITE_OTHER): Payer: Medicare Other | Admitting: Pediatrics

## 2017-12-13 ENCOUNTER — Encounter: Payer: Self-pay | Admitting: Pediatrics

## 2017-12-13 ENCOUNTER — Other Ambulatory Visit: Payer: Self-pay | Admitting: Pediatrics

## 2017-12-13 VITALS — BP 138/82 | HR 99 | Temp 98.3°F | Ht 64.0 in | Wt 193.0 lb

## 2017-12-13 DIAGNOSIS — M25562 Pain in left knee: Secondary | ICD-10-CM

## 2017-12-13 DIAGNOSIS — M179 Osteoarthritis of knee, unspecified: Secondary | ICD-10-CM | POA: Diagnosis not present

## 2017-12-13 DIAGNOSIS — S83422A Sprain of lateral collateral ligament of left knee, initial encounter: Secondary | ICD-10-CM

## 2017-12-13 NOTE — Patient Instructions (Addendum)

## 2017-12-13 NOTE — Progress Notes (Signed)
  Subjective:   Patient ID: Anita Kennedy, female    DOB: Jun 12, 1951, 67 y.o.   MRN: [id number] CC: Knee Pain (Left, Worsened since Saturday)  HPI: Anita Kennedy is a 67 y.o. female presenting for Knee Pain (Left, Worsened since Saturday)  5 days ago was raking leaves all day The next day L knee felt a little tender, put on a knee brace while she was kicking leaves Knee felt tired end of the day Stayed off of it all day three days ago Episode two days ago of severe, "wanted to die" acute pain next day when she was putting on slide-on shoe. Lasted for a few seconds. Has been stiff, sore since then. She has latered her walking, can put weight on it but avoiding it, walking with a crutch or cane, bc afraid of that pain returning  Has been using ice on the knee. Taken ibuprofen a few times  Points to lateral L knee, medial knee, below patella when asked where pain is  Sometimes L knee bothers her a little with either raking or going up ladders at baseline  Episode of R ear pain yesterday that lasted for 5-10 min then went away. No pain now. No URI symptoms.  Relevant past medical, surgical, family and social history reviewed. Allergies and medications reviewed and updated. Social History   Tobacco Use  Smoking Status Former Smoker  . Types: Cigarettes  . Last attempt to quit: 10/05/2013  . Years since quitting: 4.1  Smokeless Tobacco Never Used   ROS: Per HPI   Objective:    BP 138/82   Pulse 99   Temp 98.3 F (36.8 C) (Oral)   Ht 5\' 4"  (1.626 m)   Wt 193 lb (87.5 kg)   BMI 33.13 kg/m   Wt Readings from Last 3 Encounters:  12/13/17 193 lb (87.5 kg)  10/08/17 195 lb 12.8 oz (88.8 kg)  08/13/17 197 lb (89.4 kg)   Gen: NAD, alert, cooperative with exam, NCAT EYES: EOMI, no conjunctival injection, or no icterus ENT:  TMs slightly pink b/l, L TM with clear effusion, OP without erythema LYMPH: no cervical LAD CV: NRRR, normal S1/S2, no murmur, distal pulses 2+ b/l Resp:  CTABL, no wheezes, normal WOB Ext: No edema at ankles b/l, warm Neuro: Alert and oriented MSK: L knee with mod effusion, more swollen compared to right Can bend L knee slowly up to 90 degrees, then limited by pain No pain with patellar rocking L knee No joint line tenderness Some tenderness over LCL No joint laxity   Assessment & Plan:  Anita Kennedy was seen today for knee pain.  Diagnoses and all orders for this visit:  Acute pain of left knee -     Cancel: DG Knee Complete 4 Views Left; Future  Sprain of lateral collateral ligament of left knee, initial encounter Rest, ice, NSAIDs If not improving will refer to ortho Weight bearing as tolerated  Follow up plan: Return if symptoms worsen or fail to improve. [name], MD [place]

## 2017-12-24 ENCOUNTER — Telehealth: Payer: Self-pay | Admitting: Pediatrics

## 2017-12-24 DIAGNOSIS — M25569 Pain in unspecified knee: Secondary | ICD-10-CM

## 2017-12-24 NOTE — Telephone Encounter (Signed)
Pt notified of recommendation Pt in agreement with plan Pt wants [place] area for referral

## 2017-12-24 NOTE — Telephone Encounter (Signed)
Pt is having continued knee pain Worse when walking Pt requesting MRI Please advise

## 2017-12-26 ENCOUNTER — Ambulatory Visit (INDEPENDENT_AMBULATORY_CARE_PROVIDER_SITE_OTHER): Payer: Medicare Other | Admitting: Orthopedic Surgery

## 2018-01-02 ENCOUNTER — Encounter (INDEPENDENT_AMBULATORY_CARE_PROVIDER_SITE_OTHER): Payer: Self-pay | Admitting: Orthopaedic Surgery

## 2018-01-02 ENCOUNTER — Ambulatory Visit (INDEPENDENT_AMBULATORY_CARE_PROVIDER_SITE_OTHER): Payer: Medicare Other | Admitting: Orthopaedic Surgery

## 2018-01-02 VITALS — BP 143/94 | HR 90 | Resp 14 | Ht 64.0 in | Wt 200.0 lb

## 2018-01-02 DIAGNOSIS — M25562 Pain in left knee: Secondary | ICD-10-CM

## 2018-01-02 MED ORDER — BUPIVACAINE HCL 0.5 % IJ SOLN
2.0000 mL | INTRAMUSCULAR | Status: AC | PRN
  Administered 2018-01-02: 2 mL via INTRA_ARTICULAR

## 2018-01-02 MED ORDER — METHYLPREDNISOLONE ACETATE 40 MG/ML IJ SUSP
80.0000 mg | INTRAMUSCULAR | Status: AC | PRN
  Administered 2018-01-02: 80 mg

## 2018-01-02 MED ORDER — LIDOCAINE HCL 1 % IJ SOLN
2.0000 mL | INTRAMUSCULAR | Status: AC | PRN
  Administered 2018-01-02: 2 mL

## 2018-01-02 NOTE — Progress Notes (Signed)
Office Visit Note   Patient: Anita Kennedy           Date of Birth: [birth year]           MRN: [id number] Visit Date: 01/02/2018              Requested by: Anita Maize, MD [place], [place] [address] PCP: Anita Maize, MD   Assessment & Plan: Visit Diagnoses:  1. Acute pain of left knee     Plan: Left knee pain with several diagnostic possibilities. This would include acute episode of arthritis and or tear the medial meniscus. We'll try an intra-articular cortisone injection and monitor her response over the next 3 weeks. If no improvement would consider an MRI scan. Long discussion regarding diagnostic possibilities and treatment options over approximally 45 minutes. If her pain is related to the arthritis there injection should make a big difference. If she truly has a tear of the posterior horn of the medial meniscus that she'll have minimal response to the injection. She'll let us know  Follow-Up Instructions: Return in about 3 weeks (around 01/23/2018).   Orders:  Orders Placed This Encounter  Procedures  . Large Joint Inj: L knee   No orders of the defined types were placed in this encounter.     Procedures: Large Joint Inj: L knee on 01/02/2018 10:03 AM Indications: pain and diagnostic evaluation Details: 25 G 1.5 in needle, anteromedial approach  Arthrogram: No  Medications: 2 mL lidocaine 1 %; 2 mL bupivacaine 0.5 %; 80 mg methylPREDNISolone acetate 40 MG/ML Procedure, treatment alternatives, risks and benefits explained, specific risks discussed. Consent was given by the patient. Patient was prepped and draped in the usual sterile fashion.       Clinical Data: No additional findings.   Subjective: Chief Complaint  Patient presents with  . Left Knee - Pain, Weakness    Anita Kennedy is a 67 y o here for left knee pain x 3 weeks after raking leaves.  Pt relates knee giving away- ""wentr to her knee."  Anita Kennedy relates initial onset of  left knee pain about 3 weeks ago after raking some leaves in her yard. She had considerable pain to the point where she was had difficulty bearing weight. She does not remember specific injury or trauma. Her knee progressively became more and more painful and "swollen". She was seen at [facility] where she was instructed on weightbearing as tolerated and using rest ice and NSAIDs. X-rays of her knee were obtained in several projections. I reviewed these on the PACS system. Films of her right knee were also obtained demonstrating significant degenerative changes. For a minimal degenerative change on the left symptomatic knee. He is definitely better but still feels a feeling of instability and pain predominantly along the medial aspect of her knee. No locking. No fever or chills. No prior problems with her knee  HPI  Review of Systems  Constitutional: Negative for chills, fatigue and fever.  HENT: Negative for hearing loss and tinnitus.   Eyes: Negative for itching.  Respiratory: Negative for chest tightness and shortness of breath.   Cardiovascular: Negative for chest pain, palpitations and leg swelling.  Gastrointestinal: Negative for blood in stool, constipation and diarrhea.  Endocrine: Negative for polyuria.  Genitourinary: Negative for dysuria.  Musculoskeletal: Positive for joint swelling. Negative for back pain, neck pain and neck stiffness.  Allergic/Immunologic: Negative for immunocompromised state.  Neurological: Positive for weakness.  Negative for dizziness, numbness and headaches.  Hematological: Does not bruise/bleed easily.  Psychiatric/Behavioral: Negative for sleep disturbance. The patient is not nervous/anxious.      Objective: Vital Signs: BP (!) 143/94   Pulse 90   Resp 14   Ht 5\' 4"  (1.626 m)   Wt 200 lb (90.7 kg)   BMI 34.33 kg/m   Physical Exam  Ortho Exam awake alert and oriented 3 and very pleasant. Examination of the left knee  demonstrated no evidence of effusion. No instability with a varus or valgus stress or an anterior drawer sign. There is a diffuse predominantly posterior medial joint pain. Very minimal pain anteriorly. No popping or clicking. Full extension and flexion over 110. No popliteal pain. No calf discomfort or distal edema.  Specialty Comments:  No specialty comments available.  Imaging: No results found.   PMFS History: Patient Active Problem List   Diagnosis Date Noted  . Obesity 10/08/2017  . Pre-diabetes 10/08/2017  . Special screening for malignant neoplasms, colon   . Hyperlipidemia 10/05/2016  . Hyperglycemia 10/05/2016  . Elevated blood pressure reading 10/05/2016   Past Medical History:  Diagnosis Date  . Arthritis   . Kidney stones   . Plantar fasciitis of right foot     Family History  Problem Relation Age of Onset  . Alcohol abuse Father   . Early death Father   . Heart disease Father   . Hyperlipidemia Father   . Hypertension Father   . Cancer Maternal Grandmother   . Depression Maternal Grandmother   . Breast cancer Maternal Grandmother        unsure of age   . Colon cancer Neg Hx     Past Surgical History:  Procedure Laterality Date  . BREAST CYST ASPIRATION Left    1990s  . CESAREAN SECTION    . CHOLECYSTECTOMY    . COLONOSCOPY N/A 11/03/2016   Procedure: COLONOSCOPY;  Surgeon: Anita Binder, MD;  Location[facility];  Service: Endoscopy;  Laterality: N/A;  1;00 pm  . MOLE REMOVAL    . TONSILLECTOMY     Social History   Occupational History  . Not on file  Tobacco Use  . Smoking status: Former Smoker    Types: Cigarettes    Last attempt to quit: 10/05/2013    Years since quitting: 4.2  . Smokeless tobacco: Never Used  Substance and Sexual Activity  . Alcohol use: No  . Drug use: No  . Sexual activity: Not on file

## 2018-02-24 DIAGNOSIS — M25531 Pain in right wrist: Secondary | ICD-10-CM | POA: Diagnosis not present

## 2018-02-24 DIAGNOSIS — Z6831 Body mass index (BMI) 31.0-31.9, adult: Secondary | ICD-10-CM | POA: Diagnosis not present

## 2018-02-27 ENCOUNTER — Ambulatory Visit (INDEPENDENT_AMBULATORY_CARE_PROVIDER_SITE_OTHER): Payer: Medicare Other | Admitting: Pediatrics

## 2018-02-27 ENCOUNTER — Encounter: Payer: Self-pay | Admitting: Pediatrics

## 2018-02-27 VITALS — BP 133/85 | HR 84 | Temp 97.8°F | Ht 64.0 in | Wt 189.2 lb

## 2018-02-27 DIAGNOSIS — Z6832 Body mass index (BMI) 32.0-32.9, adult: Secondary | ICD-10-CM | POA: Diagnosis not present

## 2018-02-27 DIAGNOSIS — M25531 Pain in right wrist: Secondary | ICD-10-CM | POA: Diagnosis not present

## 2018-02-27 NOTE — Progress Notes (Signed)
  Subjective:   Patient ID: Anita Kennedy, female    DOB: 04-19-1951, 67 y.o.   MRN: [id number] CC: Follow-up (4 month)  HPI: Anita Kennedy is a 67 y.o. female presenting for Follow-up (4 month)  Elevated blood pressures: Usually at home systolics in the [address]N.  Sometimes upper 120s.  Elevated BMI: Increased vegetable intake.  Minimizing sugar intake.  Taking two tylenol and three ibuprofen recently for tooth pain with adequate control.  Brought grocery bag in on her right wrist which is been bothering her off and on on Sunday.  Had sharp pain radial side of right wrist which was significant enough that she went to urgent care.  She got a Toradol shot and a steroid shot with much improvement.  She was also given 10 days of gabapentin.  The gabapentin makes her feel little lightheaded, has been trying to take it easy since then.  Wrist is feeling much better.  She wears a splint for comfort when needed.  No fall injury to the wrist.  Relevant past medical, surgical, family and social history reviewed. Allergies and medications reviewed and updated. Social History   Tobacco Use  Smoking Status Former Smoker  . Types: Cigarettes  . Last attempt to quit: 10/05/2013  . Years since quitting: 4.4  Smokeless Tobacco Never Used   ROS: Per HPI   Objective:    BP 133/85   Pulse 84   Temp 97.8 F (36.6 C) (Oral)   Ht 5\' 4"  (1.626 m)   Wt 189 lb 3.2 oz (85.8 kg)   BMI 32.48 kg/m   Wt Readings from Last 3 Encounters:  02/27/18 189 lb 3.2 oz (85.8 kg)  01/02/18 200 lb (90.7 kg)  12/13/17 193 lb (87.5 kg)    Gen: NAD, alert, cooperative with exam, NCAT EYES: EOMI, no conjunctival injection, or no icterus CV: NRRR, normal S1/S2, no murmur, distal pulses 2+ b/l Resp: CTABL, no wheezes, normal WOB Ext: No edema, warm Neuro: Alert and oriented MSK: no swelling right wrist.  No redness right wrist.  Slightly decreased right wrist extension and eversion.  Minimally tender to palpation along  tendons base of right thumb.  Assessment & Plan:  Lennette was seen today for follow-up.  Diagnoses and all orders for this visit:  Right wrist pain Improving.  Tylenol and ibuprofen as needed.  BMI 32.0-32.9,adult 10 pound weight loss since last visit.  Continue lifestyle changes.  Has treadmill at home, planning to use regularly.  Follow up plan: Return in about 7 months (around 10/10/2018). Anita Found, MD [place]

## 2018-08-05 ENCOUNTER — Ambulatory Visit (INDEPENDENT_AMBULATORY_CARE_PROVIDER_SITE_OTHER): Payer: Medicare Other | Admitting: *Deleted

## 2018-08-05 DIAGNOSIS — Z23 Encounter for immunization: Secondary | ICD-10-CM

## 2018-10-10 ENCOUNTER — Encounter: Payer: Self-pay | Admitting: *Deleted

## 2018-10-22 ENCOUNTER — Ambulatory Visit (INDEPENDENT_AMBULATORY_CARE_PROVIDER_SITE_OTHER): Payer: Medicare Other | Admitting: Pediatrics

## 2018-10-22 ENCOUNTER — Encounter: Payer: Self-pay | Admitting: Pediatrics

## 2018-10-22 VITALS — BP 134/82 | HR 72 | Temp 98.0°F | Ht 64.0 in | Wt 190.0 lb

## 2018-10-22 DIAGNOSIS — E785 Hyperlipidemia, unspecified: Secondary | ICD-10-CM | POA: Diagnosis not present

## 2018-10-22 DIAGNOSIS — Z6832 Body mass index (BMI) 32.0-32.9, adult: Secondary | ICD-10-CM

## 2018-10-22 DIAGNOSIS — Z1239 Encounter for other screening for malignant neoplasm of breast: Secondary | ICD-10-CM

## 2018-10-22 DIAGNOSIS — R7303 Prediabetes: Secondary | ICD-10-CM | POA: Diagnosis not present

## 2018-10-22 LAB — BAYER DCA HB A1C WAIVED: HB A1C (BAYER DCA - WAIVED): 5.6 % (ref <7.0)

## 2018-10-22 NOTE — Patient Instructions (Signed)
Shirley, Call for appointment

## 2018-10-22 NOTE — Progress Notes (Signed)
  Subjective:   Patient ID: Anita Kennedy, female    DOB: [birth year], 67 y.o.   MRN: [id number] CC: Follow-up multiple medical problems HPI: Anita Kennedy is a 67 y.o. female   [place] died suddenly 3 months ago.  Has gained some weight in the last few months, working on getting it back off.  Feels like her mood is been doing okay.  Hyperlipidemia: Not on statin.  Working on diet and exercise changes.  Prediabetes and elevated BMI: Weight down 10 pounds over the last year.  Avoiding sugary foods, eating fruits and vegetables regularly  Mammogram: Due  Has had good exercise tolerance.  No shortness of breath or chest pain with exertion.  Relevant past medical, surgical, family and social history reviewed. Allergies and medications reviewed and updated. Social History   Tobacco Use  Smoking Status Former Smoker  . Types: Cigarettes  . Last attempt to quit: 10/05/2013  . Years since quitting: 5.0  Smokeless Tobacco Never Used   ROS: Per HPI   Objective:    BP 134/82   Pulse 72   Temp 98 F (36.7 C) (Oral)   Ht _0  (1.626 m)   Wt 190 lb (86.2 kg)   BMI 32.61 kg/m   Wt Readings from Last 3 Encounters:  10/22/18 190 lb (86.2 kg)  02/27/18 189 lb 3.2 oz (85.8 kg)  01/02/18 200 lb (90.7 kg)    Gen: NAD, alert, cooperative with exam, NCAT EYES: EOMI, no conjunctival injection, or no icterus ENT: Left TM dull, with clear effusion, normal right TM, OP without erythema LYMPH: no cervical LAD CV: NRRR, normal S1/S2, no murmur, distal pulses 2+ b/l Resp: CTABL, no wheezes, normal WOB Abd: +BS, soft, NTND. no guarding or organomegaly Ext: No edema, warm Neuro: Alert and oriented, strength equal b/l UE and LE, coordination grossly normal MSK: normal muscle bulk  Assessment & Plan:  Anita Kennedy was seen today for follow-up multiple medical problems  Diagnoses and all orders for this visit:  Hyperlipidemia, unspecified hyperlipidemia type Not currently on medicines, wants to  avoid medicine is much as possible.  No history of stroke or heart attack. -     Lipid panel  Pre-diabetes BMI 32.0-32.9,adult Continue lifestyle changes, decrease carbohydrates, increase physical activity -     Bayer DCA Hb A1c Waived -     BMP8+EGFR  Screening for breast cancer Phone number given for breast center.  Follow up plan: Return in about 6 months (around 04/23/2019). [name], MD [place]

## 2018-10-23 LAB — LIPID PANEL
CHOLESTEROL TOTAL: 249 mg/dL — AB (ref 100–199)
Chol/HDL Ratio: 3.8 ratio (ref 0.0–4.4)
HDL: 66 mg/dL (ref >39)
LDL Calculated: 163 mg/dL — ABNORMAL HIGH (ref 0–99)
Triglycerides: 101 mg/dL (ref 0–149)
VLDL CHOLESTEROL CAL: 20 mg/dL (ref 5–40)

## 2018-10-23 LAB — BMP8+EGFR
BUN/Creatinine Ratio: 21 (ref 12–28)
BUN: 18 mg/dL (ref 8–27)
CALCIUM: 9.9 mg/dL (ref 8.7–10.3)
CHLORIDE: 102 mmol/L (ref 96–106)
CO2: 24 mmol/L (ref 20–29)
Creatinine, Ser: 0.86 mg/dL (ref 0.57–1.00)
GFR calc non Af Amer: 70 mL/min/{1.73_m2} (ref >59)
GFR, EST AFRICAN AMERICAN: 80 mL/min/{1.73_m2} (ref >59)
Glucose: 100 mg/dL — ABNORMAL HIGH (ref 65–99)
Potassium: 4.9 mmol/L (ref 3.5–5.2)
Sodium: 142 mmol/L (ref 134–144)

## 2018-11-08 ENCOUNTER — Telehealth: Payer: Self-pay | Admitting: Pediatrics

## 2018-11-08 MED ORDER — PRAVASTATIN SODIUM 20 MG PO TABS: 20.0000 mg | ORAL_TABLET | Freq: Every evening | ORAL | 1 refills | Status: DC

## 2018-11-08 NOTE — Telephone Encounter (Signed)
Pt called back and wants to start on the pravastatin that she was advised to take after getting her test results yesterday and declined at first. Advised pt rx sent to pharmacy.

## 2019-03-19 ENCOUNTER — Other Ambulatory Visit: Payer: Self-pay | Admitting: Family Medicine

## 2019-03-19 DIAGNOSIS — Z1231 Encounter for screening mammogram for malignant neoplasm of breast: Secondary | ICD-10-CM

## 2019-03-24 ENCOUNTER — Other Ambulatory Visit: Payer: Self-pay

## 2019-03-24 ENCOUNTER — Ambulatory Visit (INDEPENDENT_AMBULATORY_CARE_PROVIDER_SITE_OTHER): Payer: Medicare Other | Admitting: *Deleted

## 2019-03-24 DIAGNOSIS — Z0000 Encounter for general adult medical examination without abnormal findings: Secondary | ICD-10-CM | POA: Diagnosis not present

## 2019-03-24 NOTE — Progress Notes (Signed)
MEDICARE ANNUAL WELLNESS VISIT  03/24/2019  Telephone Visit Disclaimer This Medicare AWV was conducted by telephone due to national recommendations for restrictions regarding the COVID-19 Pandemic (e.g. social distancing).  I verified, using two identifiers, that I am speaking with Anita Kennedy or their authorized healthcare agent. I discussed the limitations, risks, security, and privacy concerns of performing an evaluation and management service by telephone and the potential availability of an in-person appointment in the future. The patient expressed understanding and agreed to proceed.   Subjective:  Anita Kennedy is a 68 y.o. female patient of Anita Norlander, DO who had a Medicare Annual Wellness Visit today via telephone. Marchele is Retired and lives alone. she has 9 children. she reports that she is socially active and does interact with friends/family regularly. she is moderately physically active and enjoys mowing her yard.  Patient Care Team: Anita Norlander, DO as PCP - General (Family Medicine)  Advanced Directives 03/24/2019 08/13/2017 11/03/2016  Does Patient Have a Medical Advance Directive? No No No  Would patient like information on creating a medical advance directive? No - Patient declined Yes (ED - Information included in AVS) No - Patient declined    Hospital Utilization Over the Past 12 Months: # of hospitalizations or ER visits: 0 # of surgeries: 0  Review of Systems    Patient reports that her overall health is unchanged compared to last year.  Patient Reported Readings (BP, Pulse, CBG, Weight, etc) none  Review of Systems: No complaints  All other systems negative.  Pain Assessment Pain : No/denies pain     Current Medications & Allergies (verified) Allergies as of 03/24/2019      Reactions   Pineapple Anaphylaxis   Penicillins Hives   Nickel Rash      Medication List       Accurate as of Mar 24, 2019  3:45 PM. If you have any  questions, ask your nurse or doctor.        acetaminophen 650 MG CR tablet Commonly known as:  TYLENOL Take 1,300 mg by mouth every 8 (eight) hours as needed for pain.   calcium carbonate 1250 MG capsule Take 1,250 mg by mouth 2 (two) times daily with a meal.   cholecalciferol 10 MCG (400 UNIT) Tabs tablet Commonly known as:  VITAMIN D3 Take 400 Units by mouth.   ibuprofen 200 MG tablet Commonly known as:  ADVIL Take 200 mg by mouth every 6 (six) hours as needed.   pravastatin 20 MG tablet Commonly known as:  PRAVACHOL Take 1 tablet (20 mg total) by mouth every evening.   VITAMIN-B COMPLEX PO Take by mouth.       History (reviewed): Past Medical History:  Diagnosis Date  . Arthritis   . Hyperlipidemia   . Kidney stones   . Plantar fasciitis of right foot    Past Surgical History:  Procedure Laterality Date  . BREAST CYST ASPIRATION Left    1990s  . CESAREAN SECTION    . CHOLECYSTECTOMY    . COLONOSCOPY N/A 11/03/2016   Procedure: COLONOSCOPY;  Surgeon: Danie Binder, MD;  Location: [facility];  Service: Endoscopy;  Laterality: N/A;  1;00 pm  . MOLE REMOVAL    . TONSILLECTOMY     Family History  Problem Relation Age of Onset  . Alcohol abuse Father   . Early death Father   . Heart disease Father   . Hyperlipidemia Father   . Hypertension  Father   . Cancer Maternal Grandmother   . Depression Maternal Grandmother   . Breast cancer Maternal Grandmother        unsure of age   . Colon cancer Neg Hx    Social History   Socioeconomic History  . Marital status: Divorced    Spouse name: Not on file  . Number of children: 9  . Years of education: some college  . Highest education level: High school graduate  Occupational History  . Occupation: Retired  Scientific laboratory technician  . Financial resource strain: Not hard at all  . Food insecurity:    Worry: Never true    Inability: Never true  . Transportation needs:    Medical: No    Non-medical: No  Tobacco Use   . Smoking status: Former Smoker    Packs/day: 0.25    Types: Cigarettes    Last attempt to quit: 10/05/2013    Years since quitting: 5.4  . Smokeless tobacco: Never Used  Substance and Sexual Activity  . Alcohol use: No  . Drug use: No  . Sexual activity: Not Currently    Birth control/protection: Post-menopausal  Lifestyle  . Physical activity:    Days per week: 4 days    Minutes per session: 50 min  . Stress: Not at all  Relationships  . Social connections:    Talks on phone: More than three times a week    Gets together: Three times a week    Attends religious service: Never    Active member of club or organization: No    Attends meetings of clubs or organizations: Never    Relationship status: Divorced  Other Topics Concern  . Not on file  Social History Narrative  . Not on file    Activities of Daily Living In your present state of health, do you have any difficulty performing the following activities: 03/24/2019  Hearing? N  Vision? N  Difficulty concentrating or making decisions? N  Walking or climbing stairs? N  Dressing or bathing? N  Doing errands, shopping? N  Preparing Food and eating ? N  Using the Toilet? N  In the past six months, have you accidently leaked urine? N  Do you have problems with loss of bowel control? N  Managing your Medications? N  Managing your Finances? N  Housekeeping or managing your Housekeeping? N  Some recent data might be hidden    Patient Literacy How often do you need to have someone help you when you read instructions, pamphlets, or other written materials from your doctor or pharmacy?: 1 - Never What is the last grade level you completed in school?: 12th grade  Exercise Current Exercise Habits: Home exercise routine, Type of exercise: treadmill;walking;Other - see comments(stationary bike ), Time (Minutes): 50, Frequency (Times/Week): 4, Weekly Exercise (Minutes/Week): 200, Intensity: Moderate, Exercise limited by: None  identified  Diet Patient reports consuming 3 meals a day and 1 snack(s) a day Patient reports that her primary diet is: Regular Patient reports that she does have regular access to food.   Depression Screen PHQ 2/9 Scores 03/24/2019 10/22/2018 02/27/2018 12/13/2017 12/13/2017 10/08/2017 08/13/2017  PHQ - 2 Score 0 0 0 0 0 0 0     Fall Risk Fall Risk  03/24/2019 10/22/2018 02/27/2018 12/13/2017 12/13/2017  Falls in the past year? 0 0 No No No     Objective:  Anita Kennedy seemed alert and oriented and she participated appropriately during our telephone visit.  Blood Pressure  Weight BMI  BP Readings from Last 3 Encounters:  10/22/18 134/82  02/27/18 133/85  01/02/18 (!) 143/94   Wt Readings from Last 3 Encounters:  10/22/18 190 lb (86.2 kg)  02/27/18 189 lb 3.2 oz (85.8 kg)  01/02/18 200 lb (90.7 kg)   BMI Readings from Last 1 Encounters:  10/22/18 32.61 kg/m    *Unable to obtain current vital signs, weight, and BMI due to telephone visit type  Hearing/Vision  . Stephonie did not seem to have difficulty with hearing/understanding during the telephone conversation . Reports that she has not had a formal eye exam by an eye care professional within the past year . Reports that she has not had a formal hearing evaluation within the past year *Unable to fully assess hearing and vision during telephone visit type  Cognitive Function: 6CIT Screen 03/24/2019  What Year? 0 points  What month? 0 points  What time? 0 points  Count back from 20 0 points  Months in reverse 0 points  Repeat phrase 0 points  Total Score 0    Normal Cognitive Function Screening: Yes (Normal:0-7, Significant for Dysfunction: >8)  Immunization & Health Maintenance Record Immunization History  Administered Date(s) Administered  . Hepatitis B, adult 06/25/2009, 11/11/2009  . Influenza, High Dose Seasonal PF 08/01/2017, 08/05/2018  . Pneumococcal Conjugate-13 10/05/2016  . Pneumococcal Polysaccharide-23 10/08/2017   . Tdap 01/31/2010  . Zoster 10/05/2016    Health Maintenance  Topic Date Due  . INFLUENZA VACCINE  06/07/2019  . MAMMOGRAM  10/25/2019  . TETANUS/TDAP  02/01/2020  . COLONOSCOPY  11/03/2026  . DEXA SCAN  Completed  . Hepatitis C Screening  Completed  . PNA vac Low Risk Adult  Completed       Assessment  This is a routine wellness examination for Anita Kennedy.  Health Maintenance: Due or Overdue There are no preventive care reminders to display for this patient.  Anita Kennedy does not need a referral for Community Assistance: Care Management:   no Social Work:    no Prescription Assistance:  no Nutrition/Diabetes Education:  no   Plan:  Personalized Goals Goals Addressed            This Visit's Progress   . Patient Stated (pt-stated)       Pt would like to do more traveling this year      Personalized Health Maintenance & Screening Recommendations  Pt is up to date on all vaccines and has her Mammogram scheduled for July 2.   Lung Cancer Screening Recommended: no (Low Dose CT Chest recommended if Age 15-80 years, 30 pack-year currently smoking OR have quit w/in past 15 years) Hepatitis C Screening recommended: no HIV Screening recommended: no  Advanced Directives: Written information was not prepared per patient's request.  Referrals & Orders No orders of the defined types were placed in this encounter.   Follow-up Plan . Follow-up with Anita Norlander, DO as planned    I have personally reviewed and noted the following in the patient's chart:   . Medical and social history . Use of alcohol, tobacco or illicit drugs  . Current medications and supplements . Functional ability and status . Nutritional status . Physical activity . Advanced directives . List of other physicians . Hospitalizations, surgeries, and ER visits in previous 12 months . Vitals . Screenings to include cognitive, depression, and falls . Referrals and appointments   In addition, I have reviewed and discussed with Anita Kennedy certain preventive  protocols, quality metrics, and best practice recommendations. A written personalized care plan for preventive services as well as general preventive health recommendations is available and can be mailed to the patient at her request.      signature  03/24/2019

## 2019-03-24 NOTE — Patient Instructions (Signed)
Preventive Care 69 Years and Older, Female Preventive care refers to lifestyle choices and visits with your health care provider that can promote health and wellness. What does preventive care include?  A yearly physical exam. This is also called an annual well check.  Dental exams once or twice a year.  Routine eye exams. Ask your health care provider how often you should have your eyes checked.  Personal lifestyle choices, including: ? Daily care of your teeth and gums. ? Regular physical activity. ? Eating a healthy diet. ? Avoiding tobacco and drug use. ? Limiting alcohol use. ? Practicing safe sex. ? Taking low-dose aspirin every day. ? Taking vitamin and mineral supplements as recommended by your health care provider. What happens during an annual well check? The services and screenings done by your health care provider during your annual well check will depend on your age, overall health, lifestyle risk factors, and family history of disease. Counseling Your health care provider may ask you questions about your:  Alcohol use.  Tobacco use.  Drug use.  Emotional well-being.  Home and relationship well-being.  Sexual activity.  Eating habits.  History of falls.  Memory and ability to understand (cognition).  Work and work Statistician.  Reproductive health.  Screening You may have the following tests or measurements:  Height, weight, and BMI.  Blood pressure.  Lipid and cholesterol levels. These may be checked every 5 years, or more frequently if you are over 69 years old.  Skin check.  Lung cancer screening. You may have this screening every year starting at age 69 if you have a 30-pack-year history of smoking and currently smoke or have quit within the past 15 years.  Colorectal cancer screening. All adults should have this screening starting at age 69 and continuing until age 46. You will have tests every 1-10 years, depending on your results and the  type of screening test. People at increased risk should start screening at an earlier age. Screening tests may include: ? Guaiac-based fecal occult blood testing. ? Fecal immunochemical test (FIT). ? Stool DNA test. ? Virtual colonoscopy. ? Sigmoidoscopy. During this test, a flexible tube with a tiny camera (sigmoidoscope) is used to examine your rectum and lower colon. The sigmoidoscope is inserted through your anus into your rectum and lower colon. ? Colonoscopy. During this test, a long, thin, flexible tube with a tiny camera (colonoscope) is used to examine your entire colon and rectum.  Hepatitis C blood test.  Hepatitis B blood test.  Sexually transmitted disease (STD) testing.  Diabetes screening. This is done by checking your blood sugar (glucose) after you have not eaten for a while (fasting). You may have this done every 1-3 years.  Bone density scan. This is done to screen for osteoporosis. You may have this done starting at age 69.  Mammogram. This may be done every 1-2 years. Talk to your health care provider about how often you should have regular mammograms. Talk with your health care provider about your test results, treatment options, and if necessary, the need for more tests. Vaccines Your health care provider may recommend certain vaccines, such as:  Influenza vaccine. This is recommended every year.  Tetanus, diphtheria, and acellular pertussis (Tdap, Td) vaccine. You may need a Td booster every 10 years.  Varicella vaccine. You may need this if you have not been vaccinated.  Zoster vaccine. You may need this after age 69.  Measles, mumps, and rubella (MMR) vaccine. You may need at least  one dose of MMR if you were born in 1957 or later. You may also need a second dose.  Pneumococcal 13-valent conjugate (PCV13) vaccine. One dose is recommended after age 69.  Pneumococcal polysaccharide (PPSV23) vaccine. One dose is recommended after age 69.  Meningococcal  vaccine. You may need this if you have certain conditions.  Hepatitis A vaccine. You may need this if you have certain conditions or if you travel or work in places where you may be exposed to hepatitis A.  Hepatitis B vaccine. You may need this if you have certain conditions or if you travel or work in places where you may be exposed to hepatitis B.  Haemophilus influenzae type b (Hib) vaccine. You may need this if you have certain conditions. Talk to your health care provider about which screenings and vaccines you need and how often you need them. This information is not intended to replace advice given to you by your health care provider. Make sure you discuss any questions you have with your health care provider. Document Released: 11/19/2015 Document Revised: 12/13/2017 Document Reviewed: 08/24/2015 Elsevier Interactive Patient Education  2019 [employer].

## 2019-04-23 ENCOUNTER — Ambulatory Visit: Payer: Medicare Other | Admitting: Family Medicine

## 2019-04-29 ENCOUNTER — Other Ambulatory Visit: Payer: Self-pay

## 2019-04-30 ENCOUNTER — Ambulatory Visit (INDEPENDENT_AMBULATORY_CARE_PROVIDER_SITE_OTHER): Payer: Medicare Other | Admitting: Family Medicine

## 2019-04-30 ENCOUNTER — Encounter: Payer: Self-pay | Admitting: Family Medicine

## 2019-04-30 VITALS — BP 134/82 | HR 83 | Temp 98.0°F | Ht 64.0 in | Wt 195.0 lb

## 2019-04-30 DIAGNOSIS — E669 Obesity, unspecified: Secondary | ICD-10-CM | POA: Diagnosis not present

## 2019-04-30 DIAGNOSIS — R7303 Prediabetes: Secondary | ICD-10-CM | POA: Diagnosis not present

## 2019-04-30 DIAGNOSIS — Z6833 Body mass index (BMI) 33.0-33.9, adult: Secondary | ICD-10-CM

## 2019-04-30 DIAGNOSIS — E7800 Pure hypercholesterolemia, unspecified: Secondary | ICD-10-CM

## 2019-04-30 DIAGNOSIS — E66811 Obesity, class 1: Secondary | ICD-10-CM

## 2019-04-30 DIAGNOSIS — R030 Elevated blood-pressure reading, without diagnosis of hypertension: Secondary | ICD-10-CM | POA: Diagnosis not present

## 2019-04-30 LAB — LIPID PANEL

## 2019-04-30 LAB — BAYER DCA HB A1C WAIVED: HB A1C (BAYER DCA - WAIVED): 5.7 % (ref <7.0)

## 2019-04-30 NOTE — Progress Notes (Signed)
Subjective: CC:est care with new doctor, f/u hyperglycemia, hyperlipidemia PCP: Janora Norlander, DO TIW:[id number] Anita Kennedy is a 68 y.o. female presenting to clinic today for:  Hyperlipidemia/ pre-DM/ elevated BP/ obesity Patient was last seen by previous PCP in December 2019.  At that time, she had an ASCVD risk or 8.6%.  She is started on Pravachol and follows up today for repeat lipid testing.  She notes that she has been tolerating the pravastatin without difficulty but does note that she initially missed several doses because she cannot remember to take it.  She has since been keeping the medicine by her toothbrush and has been consistent with it over the last 3 weeks.  Most of her time is spent knitting.  When she has not been eating lately she feels that she has been eating more and subsequently has had weight gain since her last visit.  She denies chest pain, shortness of breath, lower extremity edema, abdominal pain, jaundice, myalgia.   ROS: Per HPI  Allergies  Allergen Reactions   Pineapple Anaphylaxis   Penicillins Hives   Nickel Rash   Past Medical History:  Diagnosis Date   Arthritis    Hyperlipidemia    Kidney stones    Plantar fasciitis of right foot     Current Outpatient Medications:    acetaminophen (TYLENOL) 650 MG CR tablet, Take 1,300 mg by mouth every 8 (eight) hours as needed for pain., Disp: , Rfl:    B Complex Vitamins (VITAMIN-B COMPLEX PO), Take by mouth., Disp: , Rfl:    calcium carbonate 1250 MG capsule, Take 1,250 mg by mouth 2 (two) times daily with a meal., Disp: , Rfl:    cholecalciferol (VITAMIN D) 400 units TABS tablet, Take 400 Units by mouth., Disp: , Rfl:    Garlic 10 MG CAPS, Take by mouth., Disp: , Rfl:    ibuprofen (ADVIL,MOTRIN) 200 MG tablet, Take 200 mg by mouth every 6 (six) hours as needed., Disp: , Rfl:    Krill Oil 1000 MG CAPS, Take by mouth., Disp: , Rfl:    pravastatin (PRAVACHOL) 20 MG tablet, Take 1 tablet (20 mg  total) by mouth every evening., Disp: 90 tablet, Rfl: 1 Social History   Socioeconomic History   Marital status: Divorced    Spouse name: Not on file   Number of children: 9   Years of education: some college   Highest education level: High school graduate  Occupational History   Occupation: Retired  Scientist, product/process development strain: Not hard at [employer]    Worry: Never true    Inability: Never true   Transportation needs    Medical: No    Non-medical: No  Tobacco Use   Smoking status: Former Smoker    Packs/day: 0.25    Types: Cigarettes    Quit date: 10/05/2013    Years since quitting: 5.5   Smokeless tobacco: Never Used  Substance and Sexual Activity   Alcohol use: No   Drug use: No   Sexual activity: Not Currently    Birth control/protection: Post-menopausal  Lifestyle   Physical activity    Days per week: 4 days    Minutes per session: 50 min   Stress: Not at all  Relationships   Social connections    Talks on phone: More than three times a week    Gets together: Three times a week    Attends religious service: Never    Active member  of club or organization: No    Attends meetings of clubs or organizations: Never    Relationship status: Divorced   Intimate partner violence    Fear of current or ex partner: No    Emotionally abused: No    Physically abused: No    Forced sexual activity: No  Other Topics Concern   Not on file  Social History Narrative   Not on file   Family History  Problem Relation Age of Onset   Alcohol abuse Father    Early death Father    Heart disease Father    Hyperlipidemia Father    Hypertension Father    Cancer Maternal Grandmother    Depression Maternal Grandmother    Breast cancer Maternal Grandmother        unsure of age    Colon cancer Neg Hx     Objective: Office vital signs reviewed. BP 134/82    Pulse 83    Temp 98 F (36.7 C) (Oral)    Ht _0  (1.626 m)    Wt  195 lb (88.5 kg)    BMI 33.47 kg/m   Physical Examination:  General: Awake, alert, well nourished, obese. No acute distress HEENT: Normal, sclera white, MMM Cardio: regular rate and rhythm, S1S2 heard, no murmurs appreciated Pulm: clear to auscultation bilaterally, no wheezes, rhonchi or rales; normal work of breathing on room air GI: soft, non-tender, non-distended, bowel sounds present x4, no hepatomegaly, no splenomegaly, no masses Extremities: warm, well perfused, No edema, cyanosis or clubbing; +2 pulses bilaterally Skin: normal. No jaundice  The 10-year ASCVD risk score Mikey Bussing DC Jr., et al., 2013) is: 8.6%   Values used to calculate the score:     Age: 3 years     Sex: Female     Is Non-Hispanic African American: No     Diabetic: No     Tobacco smoker: No     Systolic Blood Pressure: 470 mmHg     Is BP treated: No     HDL Cholesterol: 66 mg/dL     Total Cholesterol: 249 mg/dL  Assessment/ Plan: 68 y.o. female   1. Pure hypercholesterolemia Check fasting lipid panel, LFTs.  Plan to renew pravastatin pending ASCVD risk score recalculated.  We discussed that we may need to increase pravastatin dose. - Lipid Panel - CMP14+EGFR  2. Elevated blood pressure reading Technically she is hypertensive but BP less than 140/90.  We will hold off on medications for now. - CMP14+EGFR  3. Pre-diabetes Check A1c - Bayer DCA Hb A1c Waived  4. Class 1 obesity with body mass index (BMI) of 33.0 to 33.9 in adult, unspecified obesity type, unspecified whether serious comorbidity present Identifies that she needs to work on lifestyle modification. - Lipid Panel - Bayer DCA Hb A1c Waived - CMP14+EGFR   Orders Placed This Encounter  Procedures   Lipid Panel   Bayer DCA Hb A1c Waived   CMP14+EGFR   No orders of the defined types were placed in this encounter.    Janora Norlander, DO [place] [phone]

## 2019-04-30 NOTE — Patient Instructions (Signed)
You had labs performed today.  You will be contacted with the results of the labs once they are available, usually in the next 3 business days for routine lab work.  If you had a pap smear or biopsy performed, expect to be contacted in about 7-10 days.  

## 2019-05-01 ENCOUNTER — Other Ambulatory Visit: Payer: Self-pay | Admitting: Family Medicine

## 2019-05-01 LAB — CMP14+EGFR
ALT: 29 IU/L (ref 0–32)
AST: 21 IU/L (ref 0–40)
Albumin/Globulin Ratio: 2.2 (ref 1.2–2.2)
Albumin: 4.9 g/dL — ABNORMAL HIGH (ref 3.8–4.8)
Alkaline Phosphatase: 98 IU/L (ref 39–117)
BUN/Creatinine Ratio: 22 (ref 12–28)
BUN: 20 mg/dL (ref 8–27)
Bilirubin Total: 0.4 mg/dL (ref 0.0–1.2)
CO2: 23 mmol/L (ref 20–29)
Calcium: 9.7 mg/dL (ref 8.7–10.3)
Chloride: 105 mmol/L (ref 96–106)
Creatinine, Ser: 0.93 mg/dL (ref 0.57–1.00)
GFR calc Af Amer: 73 mL/min/{1.73_m2} (ref >59)
GFR calc non Af Amer: 63 mL/min/{1.73_m2} (ref >59)
Globulin, Total: 2.2 g/dL (ref 1.5–4.5)
Glucose: 103 mg/dL — ABNORMAL HIGH (ref 65–99)
Potassium: 4.7 mmol/L (ref 3.5–5.2)
Sodium: 142 mmol/L (ref 134–144)
Total Protein: 7.1 g/dL (ref 6.0–8.5)

## 2019-05-01 LAB — LIPID PANEL
Chol/HDL Ratio: 2.7 ratio (ref 0.0–4.4)
Cholesterol, Total: 179 mg/dL (ref 100–199)
HDL: 66 mg/dL (ref >39)
LDL Calculated: 88 mg/dL (ref 0–99)
Triglycerides: 123 mg/dL (ref 0–149)
VLDL Cholesterol Cal: 25 mg/dL (ref 5–40)

## 2019-05-01 MED ORDER — PRAVASTATIN SODIUM 20 MG PO TABS: 20.0000 mg | ORAL_TABLET | Freq: Every evening | ORAL | 3 refills | Status: DC

## 2019-05-08 ENCOUNTER — Other Ambulatory Visit: Payer: Self-pay

## 2019-05-08 ENCOUNTER — Ambulatory Visit
Admission: RE | Admit: 2019-05-08 | Discharge: 2019-05-08 | Disposition: A | Discharge status: Home / Self Care | Payer: Medicare Other | Source: Ambulatory Visit | Attending: Family Medicine | Admitting: Family Medicine

## 2019-05-08 DIAGNOSIS — Z1231 Encounter for screening mammogram for malignant neoplasm of breast: Secondary | ICD-10-CM | POA: Diagnosis not present

## 2019-07-08 ENCOUNTER — Other Ambulatory Visit: Payer: Self-pay

## 2019-07-09 ENCOUNTER — Ambulatory Visit (INDEPENDENT_AMBULATORY_CARE_PROVIDER_SITE_OTHER): Payer: Medicare Other | Admitting: *Deleted

## 2019-07-09 DIAGNOSIS — Z23 Encounter for immunization: Secondary | ICD-10-CM

## 2019-10-15 ENCOUNTER — Other Ambulatory Visit: Payer: Self-pay

## 2019-10-15 DIAGNOSIS — Z20822 Contact with and (suspected) exposure to covid-19: Secondary | ICD-10-CM

## 2019-10-16 LAB — NOVEL CORONAVIRUS, NAA: SARS-CoV-2, NAA: NOT DETECTED

## 2019-10-17 ENCOUNTER — Telehealth: Payer: Self-pay | Admitting: Family Medicine

## 2019-10-17 NOTE — Telephone Encounter (Signed)
Pt advised of negative COVID results.

## 2019-10-23 ENCOUNTER — Other Ambulatory Visit: Payer: Self-pay

## 2019-10-24 ENCOUNTER — Ambulatory Visit (INDEPENDENT_AMBULATORY_CARE_PROVIDER_SITE_OTHER): Payer: Medicare Other | Admitting: Family Medicine

## 2019-10-24 ENCOUNTER — Encounter: Payer: Self-pay | Admitting: Family Medicine

## 2019-10-24 VITALS — BP 129/72 | HR 64 | Temp 98.6°F | Ht 64.0 in | Wt 183.0 lb

## 2019-10-24 DIAGNOSIS — Z6833 Body mass index (BMI) 33.0-33.9, adult: Secondary | ICD-10-CM

## 2019-10-24 DIAGNOSIS — E7800 Pure hypercholesterolemia, unspecified: Secondary | ICD-10-CM | POA: Diagnosis not present

## 2019-10-24 DIAGNOSIS — I728 Aneurysm of other specified arteries: Secondary | ICD-10-CM

## 2019-10-24 DIAGNOSIS — E6609 Other obesity due to excess calories: Secondary | ICD-10-CM | POA: Diagnosis not present

## 2019-10-24 DIAGNOSIS — B001 Herpesviral vesicular dermatitis: Secondary | ICD-10-CM | POA: Insufficient documentation

## 2019-10-24 NOTE — Patient Instructions (Signed)
You had labs performed today.  You will be contacted with the results of the labs once they are available, usually in the next 3 business days for routine lab work.  If you have an active my chart account, they will be released to your MyChart.  If you prefer to have these labs released to you via telephone, please let us know.  If you had a pap smear or biopsy performed, expect to be contacted in about 7-10 days.   Genital Herpes Genital herpes is a common sexually transmitted infection (STI) that is caused by a virus. The virus spreads from person to person through sexual contact. Infection can cause itching, blisters, and sores around the genitals or rectum. Symptoms may last several days and then go away This is called an outbreak. However, the virus remains in your body, so you may have more outbreaks in the future. The time between outbreaks varies and can be months or years. Genital herpes affects men and women. It is particularly concerning for pregnant women because the virus can be passed to the baby during delivery and can cause serious problems. Genital herpes is also a concern for people who have a weak disease-fighting (immune) system. What are the causes? This condition is caused by the herpes simplex virus (HSV) type 1 or type 2. The virus may spread through:  Sexual contact with an infected person, including vaginal, anal, and oral sex.  Contact with fluid from a herpes sore.  The skin. This means that you can get herpes from an infected partner even if he or she does not have a visible sore or does not know that he or she is infected. What increases the risk? You are more likely to develop this condition if:  You have sex with many partners.  You do not use latex condoms during sex. What are the signs or symptoms? Most people do not have symptoms (asymptomatic) or have mild symptoms that may be mistaken for other skin problems. Symptoms may include:  Small red bumps near  the genitals, rectum, or mouth. These bumps turn into blisters and then turn into sores.  Flu-like symptoms, including: ? Fever. ? Body aches. ? Swollen lymph nodes. ? Headache.  Painful urination.  Pain and itching in the genital area or rectal area.  Vaginal discharge.  Tingling or shooting pain in the legs and buttocks. Generally, symptoms are more severe and last longer during the first (primary) outbreak. Flu-like symptoms are also more common during the primary outbreak. How is this diagnosed? Genital herpes may be diagnosed based on:  A physical exam.  Your medical history.  Blood tests.  A test of a fluid sample (culture) from an open sore. How is this treated? There is no cure for this condition, but treatment with antiviral medicines that are taken by mouth (orally) can do the following:  Speed up healing and relieve symptoms.  Help to reduce the spread of the virus to sexual partners.  Limit the chance of future outbreaks, or make future outbreaks shorter.  Lessen symptoms of future outbreaks. Your health care provider may also recommend pain relief medicines, such as aspirin or ibuprofen. Follow these instructions at home: Sexual activity  Do not have sexual contact during active outbreaks.  Practice safe sex. Latex condoms and female condoms may help prevent the spread of the herpes virus. General instructions  Keep the affected areas dry and clean.  Take over-the-counter and prescription medicines only as told by your health care  provider.  Avoid rubbing or touching blisters and sores. If you do touch blisters or sores: ? Wash your hands thoroughly with soap and water. ? Do not touch your eyes afterward.  To help relieve pain or itching, you may take the following actions as directed by your health care provider: ? Apply a cold, wet cloth (cold compress) to affected areas 4-6 times a day. ? Apply a substance that protects your skin and reduces  bleeding (astringent). ? Apply a gel that helps relieve pain around sores (lidocaine gel). ? Take a warm, shallow bath that cleans the genital area (sitz bath).  Keep all follow-up visits as told by your health care provider. This is important. How is this prevented?  Use condoms. Although anyone can get genital herpes during sexual contact, even with the use of a condom, a condom can provide some protection.  Avoid having multiple sexual partners.  Talk with your sexual partner about any symptoms either of you may have. Also, talk with your partner about any history of STIs.  Get tested for STIs before you have sex. Ask your partner to do the same.  Do not have sexual contact if you have symptoms of genital herpes. Contact a health care provider if:  Your symptoms are not improving with medicine.  Your symptoms return.  You have new symptoms.  You have a fever.  You have abdominal pain.  You have redness, swelling, or pain in your eye.  You notice new sores on other parts of your body.  You are a woman and experience bleeding between menstrual periods.  You have had herpes and you become pregnant or plan to become pregnant. Summary  Genital herpes is a common sexually transmitted infection (STI) that is caused by the herpes simplex virus (HSV) type 1 or type 2.  These viruses are most often spread through sexual contact with an infected person.  You are more likely to develop this condition if you have sex with many partners or you have unprotected sex.  Most people do not have symptoms (asymptomatic) or have mild symptoms that may be mistaken for other skin problems. Symptoms occur as outbreaks that may happen months or years apart.  There is no cure for this condition, but treatment with oral antiviral medicines can reduce symptoms, reduce the chance of spreading the virus to a partner, prevent future outbreaks, or shorten future outbreaks. This information is not  intended to replace advice given to you by your health care provider. Make sure you discuss any questions you have with your health care provider. Document Released: 10/20/2000 Document Revised: 04/29/2018 Document Reviewed: 09/22/2016 Elsevier Patient Education  2020 [employer].

## 2019-10-24 NOTE — Progress Notes (Addendum)
Subjective: CC: Follow-up hyperlipidemia PCP: Janora Norlander, DO [id number] Anita Kennedy is a 68 y.o. female presenting to clinic today for:  1.  Hyperlipidemia/cold sores Patient reports compliance with pravastatin.  Does not report any concerns today.  No chest pain, shortness of breath, dizziness.  She is actively working on lifestyle modification with successful 5 pound weight loss.  She goes on to state that she is now romantically involved and also wants to be screened for HSV.  She has had history of cold sores in the past that have always responded to Abreva.  She has never had any genital sores or any abnormal vaginal discharge that she is concerned about.  Her current partner does have HSV-2.  She wants to get checked out today if possible.  2.  Splenic artery aneurysm Patient does ask questions about her splenic artery aneurysm.  This was found on CT abdomen pelvis incidentally when she was being worked up for a renal stone.  It was noted to be 1.4 cm in size on the CT abdomen pelvis performed in 2012.  She notes that it was followed up a year later and was found to be stable and therefore her doctor at that time told her she did not need further surveillance.  She denies any concerning symptoms including fatigue, change in exercise tolerance, hypotensive episodes or heart palpitations.  No abdominal pain.  ROS: Per HPI  Allergies  Allergen Reactions  . Pineapple Anaphylaxis  . Penicillins Hives  . Nickel Rash   Past Medical History:  Diagnosis Date  . Arthritis   . Hyperlipidemia   . Kidney stones   . Plantar fasciitis of right foot     Current Outpatient Medications:  .  acetaminophen (TYLENOL) 650 MG CR tablet, Take 1,300 mg by mouth every 8 (eight) hours as needed for pain., Disp: , Rfl:  .  cholecalciferol (VITAMIN D) 400 units TABS tablet, Take 400 Units by mouth., Disp: , Rfl:  .  Garlic 10 MG CAPS, Take by mouth., Disp: , Rfl:  .  ibuprofen (ADVIL,MOTRIN) 200 MG  tablet, Take 200 mg by mouth every 6 (six) hours as needed., Disp: , Rfl:  .  Krill Oil 1000 MG CAPS, Take by mouth., Disp: , Rfl:  .  pravastatin (PRAVACHOL) 20 MG tablet, Take 1 tablet (20 mg total) by mouth every evening., Disp: 90 tablet, Rfl: 3 Social History   Socioeconomic History  . Marital status: Divorced    Spouse name: Not on file  . Number of children: 9  . Years of education: some college  . Highest education level: High school graduate  Occupational History  . Occupation: Retired  Tobacco Use  . Smoking status: Former Smoker    Packs/day: 0.25    Types: Cigarettes    Quit date: 10/05/2013    Years since quitting: 6.0  . Smokeless tobacco: Never Used  Substance and Sexual Activity  . Alcohol use: No  . Drug use: No  . Sexual activity: Not Currently    Birth control/protection: Post-menopausal  Other Topics Concern  . Not on file  Social History Narrative  . Not on file   Social Determinants of Health   Financial Resource Strain: Low Risk   . Difficulty of Paying Living Expenses: Not hard at all  Food Insecurity: No Food Insecurity  . Worried About Charity fundraiser in the Last Year: Never true  . Ran Out of Food in the Last Year: Never true  Transportation  Needs: No Transportation Needs  . Lack of Transportation (Medical): No  . Lack of Transportation (Non-Medical): No  Physical Activity: Sufficiently Active  . Days of Exercise per Week: 4 days  . Minutes of Exercise per Session: 50 min  Stress: No Stress Concern Present  . Feeling of Stress : Not at all  Social Connections: Moderately Isolated  . Frequency of Communication with Friends and Family: More than three times a week  . Frequency of Social Gatherings with Friends and Family: Three times a week  . Attends Religious Services: Never  . Active Member of Clubs or Organizations: No  . Attends Archivist Meetings: Never  . Marital Status: Divorced  Human resources officer Violence: Not At Risk   . Fear of Current or Ex-Partner: No  . Emotionally Abused: No  . Physically Abused: No  . Sexually Abused: No   Family History  Problem Relation Age of Onset  . Heart disease Mother   . Alcohol abuse Father   . Early death Father   . Heart disease Father   . Hyperlipidemia Father   . Hypertension Father   . Cancer Maternal Grandmother   . Depression Maternal Grandmother   . Breast cancer Maternal Grandmother        unsure of age   . Colon cancer Neg Hx     Objective: Office vital signs reviewed. BP 129/72   Pulse 64   Temp 98.6 F (37 C) (Temporal)   Ht 5\' 4"  (1.626 m)   Wt 183 lb (83 kg)   SpO2 97%   BMI 31.41 kg/m   Physical Examination:  General: Awake, alert, well nourished, No acute distress HEENT: Normal; no active cold sores Cardio: regular rate and rhythm, S1S2 heard, no murmurs appreciated Pulm: clear to auscultation bilaterally, no wheezes, rhonchi or rales; normal work of breathing on room air MSK: normal gait and station Skin: dry; intact; no rashes or lesions Assessment/ Plan: 68 y.o. female   1. Pure hypercholesterolemia Stable.  Continue pravastatin.  She had good reduction in her LDL.  Need for fasting labs at next visit  2. Class 1 obesity due to excess calories with serious comorbidity and body mass index (BMI) of 33.0 to 33.9 in adult She is actively working on weight loss with successful 5 pound weight loss since her last visit.  Continue lifestyle modification  3. Cold sore Want to be checked out for HSV.  She is not sure if she has type I or type II.  Currently going into a romantic relationship and wants to let them know her health status. - HSV Type I/II IgG, IgMw/ reflex  4. Splenic artery aneurysm [facility]) I reviewed her CAT scan from 2012 which is found in the media tab of her chart.  A 1.4 cm splenic artery aneurysm was noted.  I will reach out to vascular to see if they have any recommendations on follow-up of this.   No orders of  the defined types were placed in this encounter.  No orders of the defined types were placed in this encounter.    Janora Norlander, DO [place] [phone]

## 2019-10-27 ENCOUNTER — Telehealth: Payer: Self-pay | Admitting: Family Medicine

## 2019-10-27 NOTE — Telephone Encounter (Signed)
Pt has results.  

## 2019-10-28 LAB — HSV TYPE I/II IGG, IGMW/ REFLEX
HSV 1 Glycoprotein G Ab, IgG: 50.1 index — ABNORMAL HIGH (ref 0.00–0.90)
HSV 1 IgM: <1:10 {titer}
HSV 2 IgG, Type Spec: <0.91 index (ref 0.00–0.90)
HSV 2 IgM: <1:10 {titer}

## 2019-11-03 ENCOUNTER — Other Ambulatory Visit: Payer: Self-pay | Admitting: Family Medicine

## 2019-11-03 DIAGNOSIS — I728 Aneurysm of other specified arteries: Secondary | ICD-10-CM

## 2019-11-21 ENCOUNTER — Ambulatory Visit (HOSPITAL_COMMUNITY)
Admission: RE | Admit: 2019-11-21 | Discharge: 2019-11-21 | Disposition: A | Discharge status: Home / Self Care | Payer: Medicare Other | Source: Ambulatory Visit | Attending: Family Medicine | Admitting: Family Medicine

## 2019-11-21 ENCOUNTER — Other Ambulatory Visit: Payer: Self-pay

## 2019-11-21 DIAGNOSIS — I728 Aneurysm of other specified arteries: Secondary | ICD-10-CM | POA: Insufficient documentation

## 2019-11-21 LAB — POCT I-STAT CREATININE: Creatinine, Ser: 0.8 mg/dL (ref 0.44–1.00)

## 2019-11-21 MED ORDER — IOHEXOL 350 MG/ML SOLN
100.0000 mL | Freq: Once | INTRAVENOUS | Status: AC | PRN
  Administered 2019-11-21: 100 mL via INTRAVENOUS

## 2020-02-02 ENCOUNTER — Ambulatory Visit (INDEPENDENT_AMBULATORY_CARE_PROVIDER_SITE_OTHER): Payer: Medicare Other

## 2020-02-02 ENCOUNTER — Other Ambulatory Visit: Payer: Self-pay

## 2020-02-02 DIAGNOSIS — Z23 Encounter for immunization: Secondary | ICD-10-CM

## 2020-02-03 ENCOUNTER — Telehealth: Payer: Self-pay | Admitting: Family Medicine

## 2020-02-03 NOTE — Telephone Encounter (Signed)
yes

## 2020-02-03 NOTE — Telephone Encounter (Signed)
Left detailed message.   

## 2020-03-02 ENCOUNTER — Telehealth: Payer: Self-pay | Admitting: Family Medicine

## 2020-03-02 NOTE — Telephone Encounter (Signed)
Aware of immunization dates.

## 2020-03-18 IMAGING — MG DIGITAL SCREENING BILATERAL MAMMOGRAM WITH TOMO AND CAD
8 series · 8 of 24 positions shown · non-contrast
Comparison: Previous exam(s).

CLINICAL DATA: Screening.

EXAM:
DIGITAL SCREENING BILATERAL MAMMOGRAM WITH TOMO AND CAD

[R MLO synth-2D]
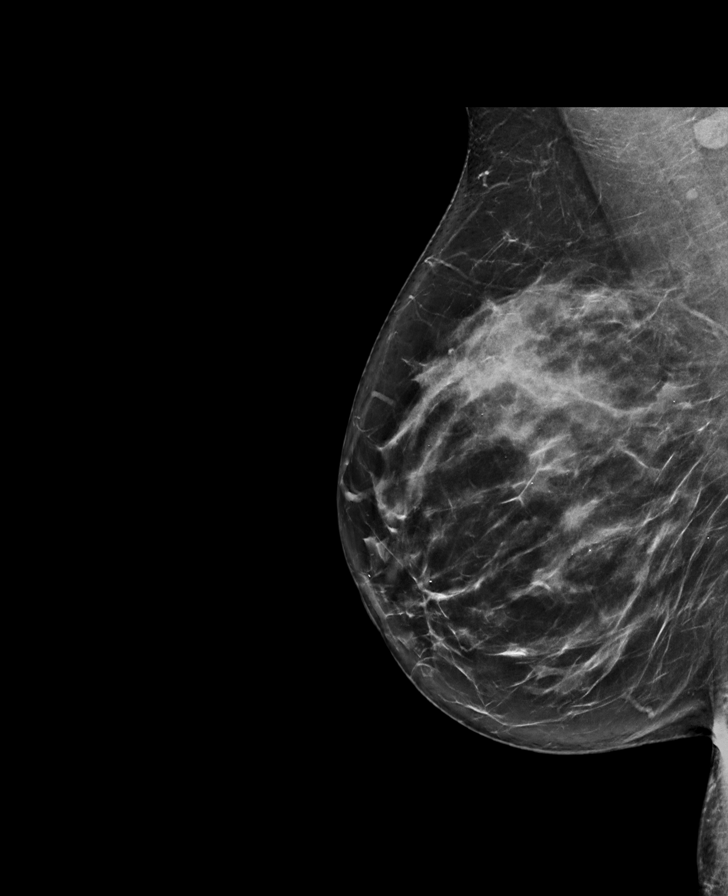

[L CC synth-2D]
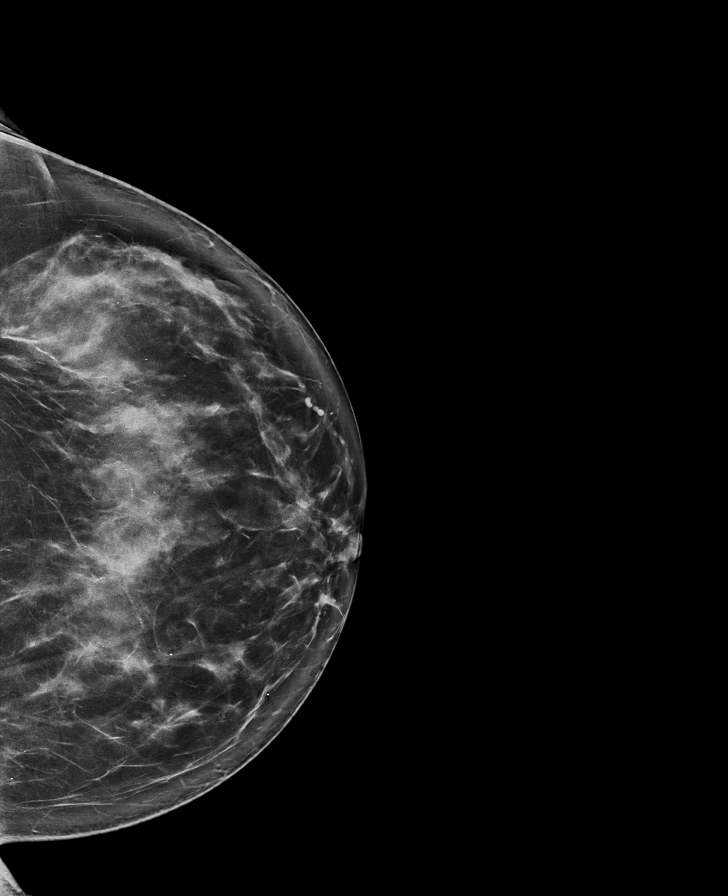

[L MLO synth-2D]
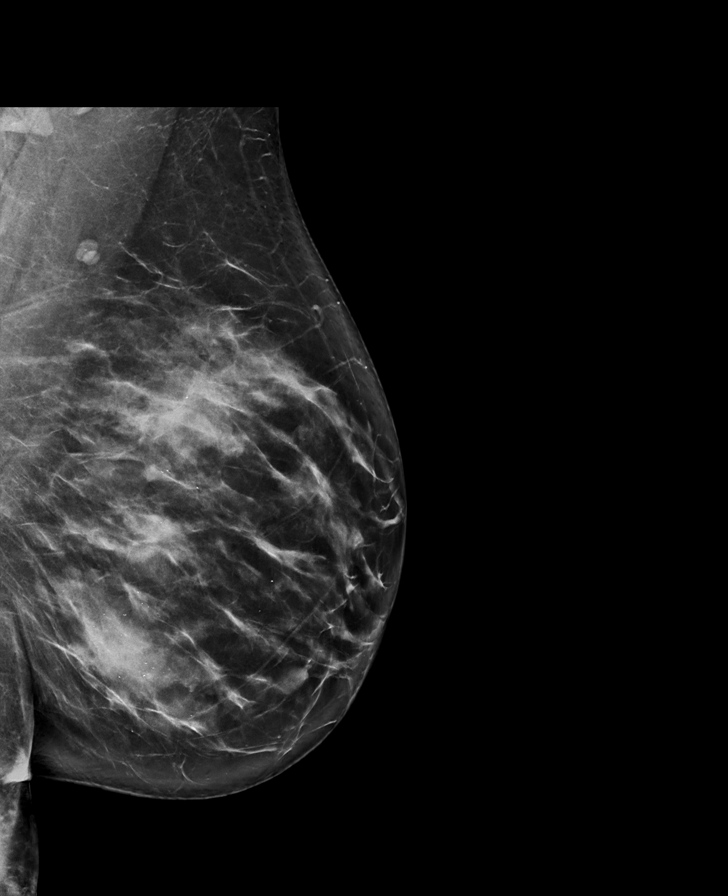

[R CC synth-2D]
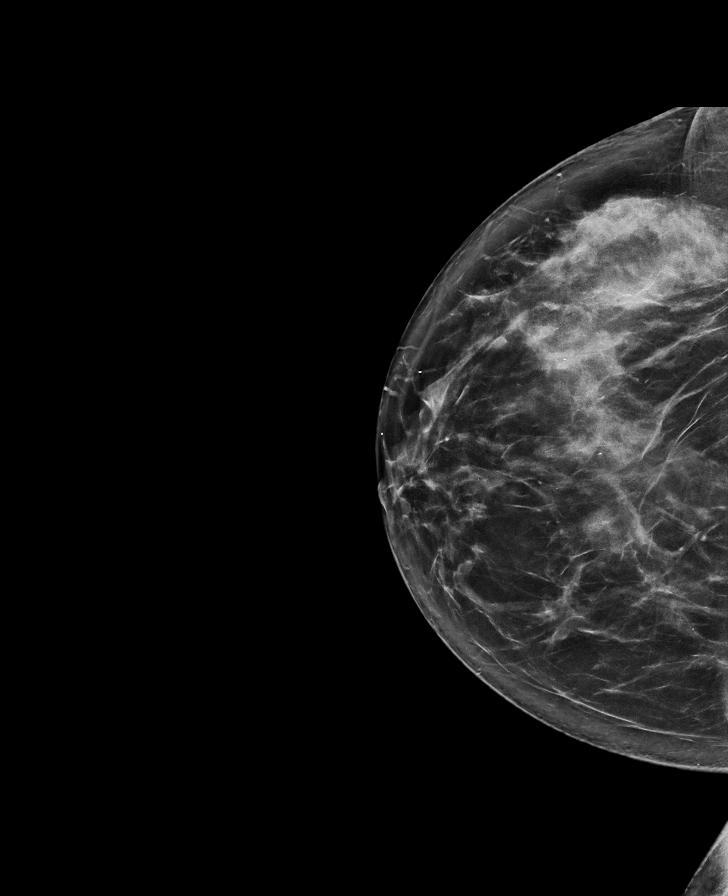

[R CC tomo · tomo slice 41/80.0]
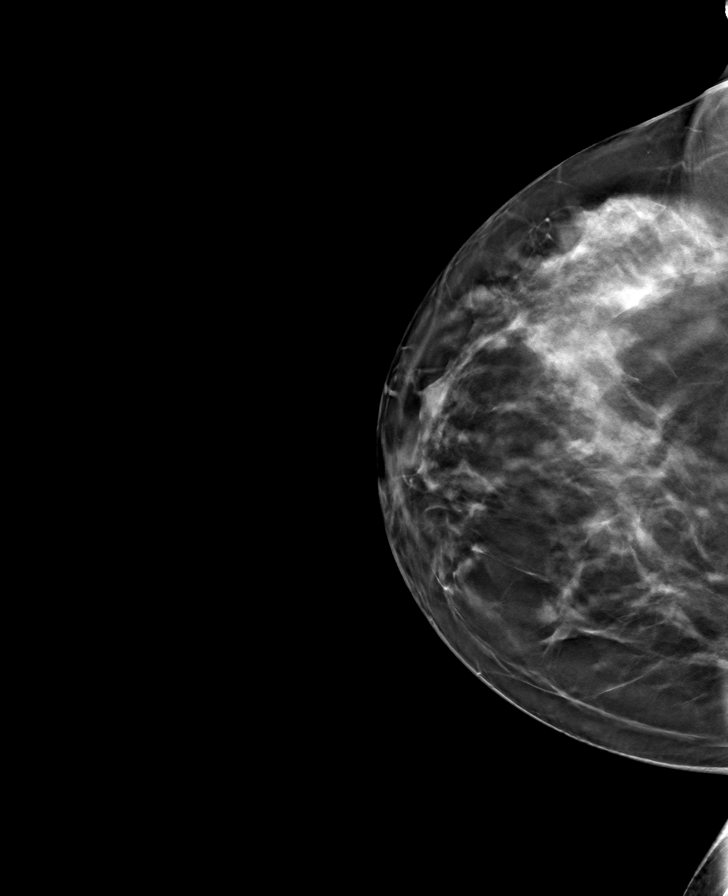

[L MLO tomo · tomo slice 43/85.0]
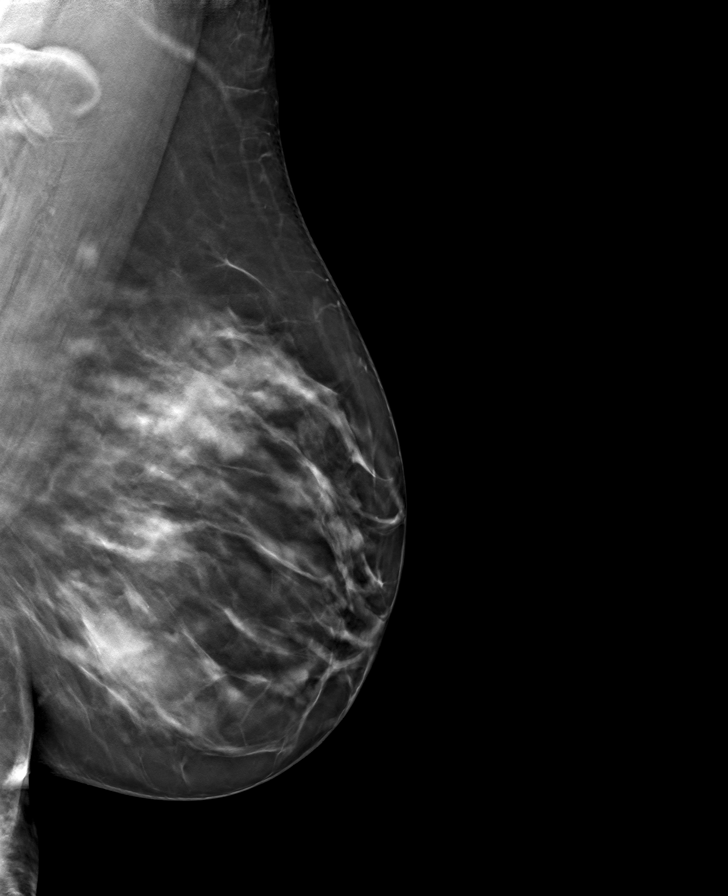

[R MLO tomo · tomo slice 42/83.0]
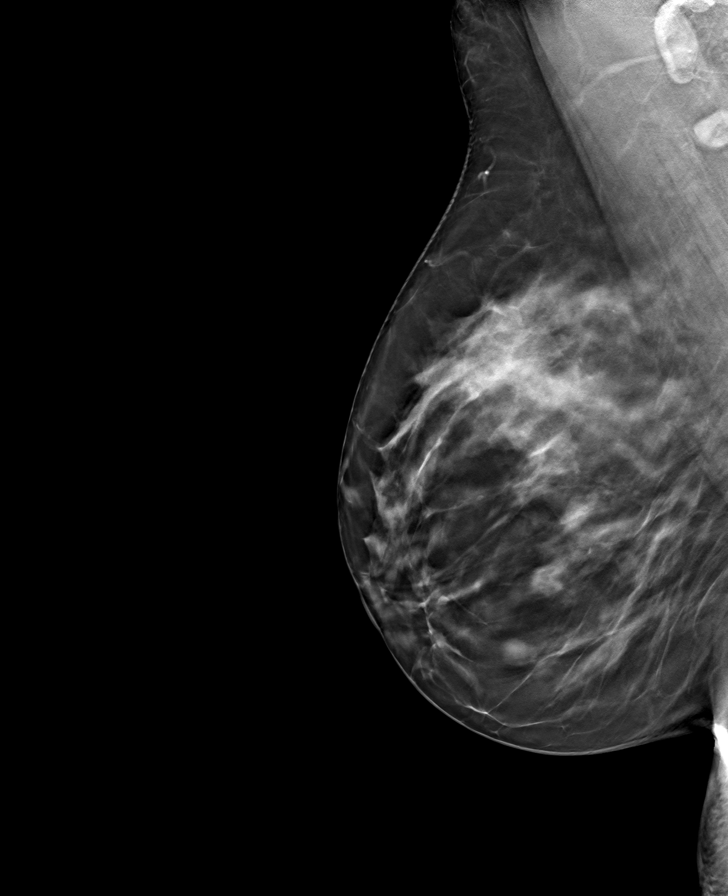

[L CC tomo · tomo slice 43/84.0]
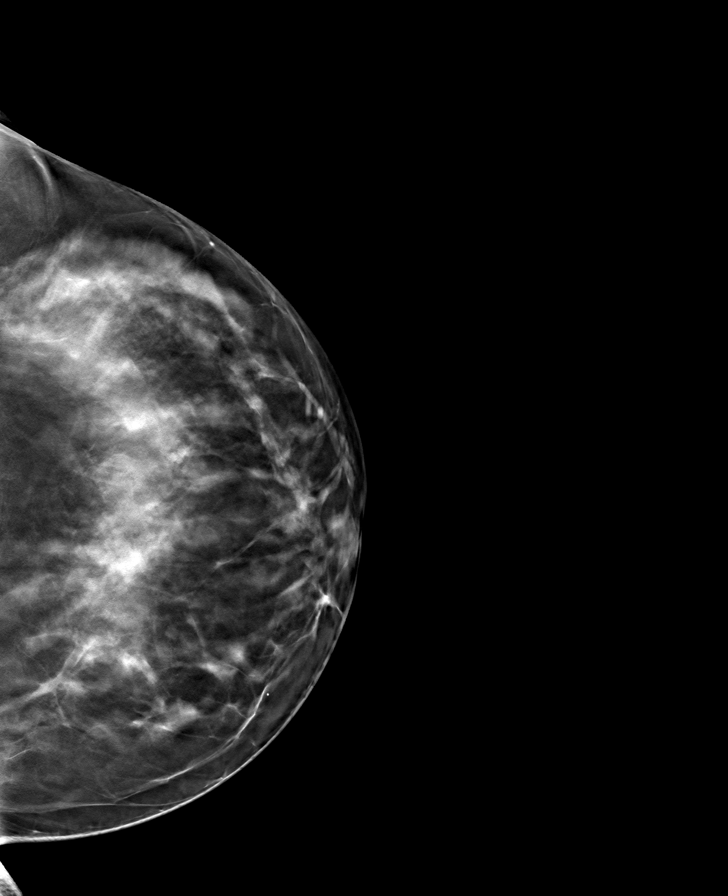

[8 of 24 positions shown; findings below may reference images not displayed]

ACR Breast Density Category c: The breast tissue is heterogeneously
dense, which may obscure small masses.
FINDINGS: There are no findings suspicious for malignancy. Images were
processed with CAD.
IMPRESSION: No mammographic evidence of malignancy. A result letter of this
screening mammogram will be mailed directly to the patient.

RECOMMENDATION:
Screening mammogram in one year. (Code:[id number]-[id number])

BI-RADS CATEGORY  1: Negative.

## 2020-04-06 ENCOUNTER — Other Ambulatory Visit: Payer: Self-pay | Admitting: Family Medicine

## 2020-04-06 DIAGNOSIS — Z1231 Encounter for screening mammogram for malignant neoplasm of breast: Secondary | ICD-10-CM

## 2020-04-19 ENCOUNTER — Ambulatory Visit: Payer: Medicare Other | Admitting: Family Medicine

## 2020-04-29 ENCOUNTER — Other Ambulatory Visit: Payer: Self-pay | Admitting: Family Medicine

## 2020-05-04 ENCOUNTER — Encounter: Payer: Self-pay | Admitting: Family Medicine

## 2020-05-04 ENCOUNTER — Ambulatory Visit (INDEPENDENT_AMBULATORY_CARE_PROVIDER_SITE_OTHER): Payer: Medicare Other | Admitting: Family Medicine

## 2020-05-04 ENCOUNTER — Other Ambulatory Visit: Payer: Self-pay

## 2020-05-04 VITALS — BP 135/82 | HR 90 | Temp 97.6°F | Ht 64.0 in | Wt 182.0 lb

## 2020-05-04 DIAGNOSIS — M609 Myositis, unspecified: Secondary | ICD-10-CM

## 2020-05-04 DIAGNOSIS — Z202 Contact with and (suspected) exposure to infections with a predominantly sexual mode of transmission: Secondary | ICD-10-CM

## 2020-05-04 DIAGNOSIS — E669 Obesity, unspecified: Secondary | ICD-10-CM

## 2020-05-04 DIAGNOSIS — T466X5A Adverse effect of antihyperlipidemic and antiarteriosclerotic drugs, initial encounter: Secondary | ICD-10-CM

## 2020-05-04 DIAGNOSIS — E7800 Pure hypercholesterolemia, unspecified: Secondary | ICD-10-CM | POA: Diagnosis not present

## 2020-05-04 LAB — BAYER DCA HB A1C WAIVED: HB A1C (BAYER DCA - WAIVED): 5.9 % (ref <7.0)

## 2020-05-04 MED ORDER — VALACYCLOVIR HCL 1 G PO TABS: 1000.0000 mg | ORAL_TABLET | Freq: Two times a day (BID) | ORAL | 0 refills | Status: DC

## 2020-05-04 NOTE — Patient Instructions (Signed)
You had labs performed today.  You will be contacted with the results of the labs once they are available, usually in the next 3 business days for routine lab work.  If you have an active my chart account, they will be released to your MyChart.  If you prefer to have these labs released to you via telephone, please let us know.  If you had a pap smear or biopsy performed, expect to be contacted in about 7-10 days.  "Valtrex is the only one approved for reducing the transmission of genital herpes.  What are other ways to prevent herpes transmission? Here are some other ways to reduce herpes transmission besides taking medication:  Condoms are one of the best ways to reduce the risk of transmission. A study from 2016 followed over 900 couples for an average of 18 months. The findings showed that condoms reduced the transmission rate with each act of intercourse from men to women by 96% and from women to men by 65%. It is important to use condoms consistently and correctly when engaging in sexual contact with an infected partner. If your partner has an active sore on their genitals, don't have sex until it clears. Even then, you'll still want to use a condom."  So basically, YOUR partner taking his valtrex EVERY day for prevention of transmission PROTECTS YOU from getting it.  Condoms should be used as well to be extra careful.

## 2020-05-04 NOTE — Progress Notes (Signed)
Subjective: CC: Follow-up hyperlipidemia, 12mf/u PCP: GJanora Norlander DO HXTG:GYIRV KCadavidis a 69y.o. female presenting to clinic today for:  1.  Hyperlipidemia Patient reports having discontinued the pravastatin a few weeks ago because she was having myalgia and arthralgias.  She has since had resolution of these ailments.  She would really like to come off of the pravastatin if possible.  She did provide a blood sample this morning to recheck cholesterol levels.  No chest pain, shortness of breath, abdominal pain  2.  Possible exposure to HSV Patient's partner has HSV-2.  They have not yet had intercourse but she thinks they may be nearing this.  She worries about potentially contracting this.  She would like to get her own prescription for HSV-2 treatment should she have an outbreak.  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  ROS: Per HPI  Allergies  Allergen Reactions   Pineapple Anaphylaxis   Penicillins Hives   Nickel Rash   Past Medical History:  Diagnosis Date   Arthritis    Hyperlipidemia    Kidney stones    Plantar fasciitis of right foot     Current Outpatient Medications:    acetaminophen (TYLENOL) 650 MG CR tablet, Take 1,300 mg by mouth every 8 (eight) hours as needed for pain., Disp: , Rfl:    cholecalciferol (VITAMIN D) 400 units TABS tablet, Take 400 Units by mouth., Disp: , Rfl:    ibuprofen (ADVIL,MOTRIN) 200 MG tablet, Take 200 mg by mouth every 6 (six) hours as needed., Disp: , Rfl:    Krill Oil 1000 MG CAPS, Take by mouth., Disp: , Rfl:    pravastatin (PRAVACHOL) 20 MG tablet, TAKE 1 TABLET BY MOUTH EVERY DAY IN THE EVENING, Disp: 90 tablet, Rfl: 0 Social History   Socioeconomic History   Marital status: Divorced    Spouse name: Not on file   Number of children: 9   Years of education: some college   Highest education level: High school graduate  Occupational History   Occupation: Retired  Tobacco Use    Smoking status: Former Smoker    Packs/day: 0.25    Types: Cigarettes    Quit date: 10/05/2013    Years since quitting: 6.5   Smokeless tobacco: Never Used  Vaping Use   Vaping Use: Never used  Substance and Sexual Activity   Alcohol use: No   Drug use: No   Sexual activity: Not Currently    Birth control/protection: Post-menopausal  Other Topics Concern   Not on file  Social History Narrative   Not on file   Social Determinants of Health   Financial Resource Strain:    Difficulty of Paying Living Expenses:   Food Insecurity:    Worried About RCharity fundraiserin the Last Year:    RArboriculturistin the Last Year:   Transportation Needs:    LFilm/video editor(Medical):    Lack of Transportation (Non-Medical):   Physical Activity:    Days of Exercise per Week:    Minutes of Exercise per Session:   Stress:    Feeling of Stress :   Social Connections:    Frequency of Communication with Friends and Family:    Frequency of Social Gatherings with Friends and Family:    Attends Religious Services:    Active Member of Clubs or Organizations:    Attends CArchivistMeetings:    Marital Status:  Intimate Partner Violence:    Fear of Current or Ex-Partner:    Emotionally Abused:    Physically Abused:    Sexually Abused:    Family History  Problem Relation Age of Onset   Heart disease Mother    Alcohol abuse Father    Early death Father    Heart disease Father    Hyperlipidemia Father    Hypertension Father    Cancer Maternal Grandmother    Depression Maternal Grandmother    Breast cancer Maternal Grandmother        unsure of age    Colon cancer Neg Hx     Objective: Office vital signs reviewed. BP 135/82    Pulse 90    Temp 97.6 F (36.4 C) (Temporal)    Ht _0  (1.626 m)    Wt 182 lb (82.6 kg)    SpO2 98%    BMI 31.24 kg/m   Physical Examination:  General: Awake, alert, well nourished, No acute  distress HEENT: Normal; sclera white.  No jaundice. Cardio: regular rate and rhythm, S1S2 heard, no murmurs appreciated Pulm: clear to auscultation bilaterally, no wheezes, rhonchi or rales; normal work of breathing on room air MSK: normal gait and station Skin: dry; intact; no rashes or lesions  The 10-year ASCVD risk score Mikey Bussing DC Jr., et al., 2013) is: 8.7%   Values used to calculate the score:     Age: 69 years     Sex: Female     Is Non-Hispanic African American: No     Diabetic: No     Tobacco smoker: No     Systolic Blood Pressure: 301 mmHg     Is BP treated: No     HDL Cholesterol: 66 mg/dL     Total Cholesterol: 179 mg/dL   Assessment/ Plan: 69 y.o. female   1. Pure hypercholesterolemia She had fasting labs obtained this morning.  She really wants to come off of the pravastatin secondary to myalgia.  We discussed alternatives including adding Vascepa.  Her overall panel does not look bad.  But her ASCVD risk score is 8.7%.  Reinforced lifestyle modification which she is certainly trying to achieve. - Lipid Panel - CMP14+EGFR - TSH  2. Statin-induced myositis  3. Obesity (BMI 30.0-34.9) - TSH - Bayer DCA Hb A1c Waived  4. Possible exposure to STD We discussed ways to prevent transmission of herpes virus including condom use, the patient's partner being on Valtrex.  I have given her her own prescription to have should she develop an outbreak or contracted this illness.  She will follow-up with me for ongoing needs. - valACYclovir (VALTREX) 1000 MG tablet; Take 1 tablet (1,000 mg total) by mouth 2 (two) times daily.  Dispense: 20 tablet; Refill: 0    No orders of the defined types were placed in this encounter.  No orders of the defined types were placed in this encounter.    Janora Norlander, DO [place] [phone]

## 2020-05-05 ENCOUNTER — Telehealth: Payer: Self-pay | Admitting: Family Medicine

## 2020-05-05 LAB — LIPID PANEL
Chol/HDL Ratio: 3.8 ratio (ref 0.0–4.4)
Cholesterol, Total: 230 mg/dL — ABNORMAL HIGH (ref 100–199)
HDL: 61 mg/dL (ref >39)
LDL Chol Calc (NIH): 153 mg/dL — ABNORMAL HIGH (ref 0–99)
Triglycerides: 89 mg/dL (ref 0–149)
VLDL Cholesterol Cal: 16 mg/dL (ref 5–40)

## 2020-05-05 LAB — CMP14+EGFR
ALT: 9 IU/L (ref 0–32)
AST: 12 IU/L (ref 0–40)
Albumin/Globulin Ratio: 1.9 (ref 1.2–2.2)
Albumin: 4.6 g/dL (ref 3.8–4.8)
Alkaline Phosphatase: 97 IU/L (ref 48–121)
BUN/Creatinine Ratio: 31 — ABNORMAL HIGH (ref 12–28)
BUN: 23 mg/dL (ref 8–27)
Bilirubin Total: <0.2 mg/dL (ref 0.0–1.2)
CO2: 23 mmol/L (ref 20–29)
Calcium: 9.4 mg/dL (ref 8.7–10.3)
Chloride: 105 mmol/L (ref 96–106)
Creatinine, Ser: 0.74 mg/dL (ref 0.57–1.00)
GFR calc Af Amer: 96 mL/min/{1.73_m2} (ref >59)
GFR calc non Af Amer: 83 mL/min/{1.73_m2} (ref >59)
Globulin, Total: 2.4 g/dL (ref 1.5–4.5)
Glucose: 91 mg/dL (ref 65–99)
Potassium: 4.7 mmol/L (ref 3.5–5.2)
Sodium: 141 mmol/L (ref 134–144)
Total Protein: 7 g/dL (ref 6.0–8.5)

## 2020-05-05 LAB — TSH: TSH: 1.11 u[IU]/mL (ref 0.450–4.500)

## 2020-05-07 ENCOUNTER — Other Ambulatory Visit: Payer: Self-pay | Admitting: Family Medicine

## 2020-05-07 MED ORDER — EZETIMIBE 10 MG PO TABS: 10.0000 mg | ORAL_TABLET | Freq: Every day | ORAL | 3 refills | Status: DC

## 2020-07-29 ENCOUNTER — Other Ambulatory Visit: Payer: Self-pay | Admitting: Family

## 2020-08-19 ENCOUNTER — Other Ambulatory Visit: Payer: Self-pay | Admitting: Family

## 2020-09-04 ENCOUNTER — Other Ambulatory Visit: Payer: Self-pay | Admitting: Family

## 2020-11-17 ENCOUNTER — Ambulatory Visit: Payer: Medicare Other | Admitting: Family Medicine

## 2020-12-21 ENCOUNTER — Ambulatory Visit
Admission: RE | Admit: 2020-12-21 | Discharge: 2020-12-21 | Disposition: A | Discharge status: Home / Self Care | Payer: Medicare Other | Source: Ambulatory Visit | Attending: Family Medicine | Admitting: Family Medicine

## 2020-12-21 ENCOUNTER — Telehealth: Payer: Self-pay

## 2020-12-21 ENCOUNTER — Ambulatory Visit (INDEPENDENT_AMBULATORY_CARE_PROVIDER_SITE_OTHER): Payer: Medicare Other | Admitting: Family Medicine

## 2020-12-21 ENCOUNTER — Other Ambulatory Visit: Payer: Self-pay

## 2020-12-21 VITALS — BP 145/84 | HR 77 | Temp 98.3°F | Ht 64.0 in | Wt 186.0 lb

## 2020-12-21 DIAGNOSIS — R7303 Prediabetes: Secondary | ICD-10-CM

## 2020-12-21 DIAGNOSIS — E7800 Pure hypercholesterolemia, unspecified: Secondary | ICD-10-CM

## 2020-12-21 DIAGNOSIS — Z1231 Encounter for screening mammogram for malignant neoplasm of breast: Secondary | ICD-10-CM | POA: Diagnosis not present

## 2020-12-21 LAB — CMP14+EGFR
ALT: 17 IU/L (ref 0–32)
AST: 18 IU/L (ref 0–40)
Albumin/Globulin Ratio: 1.8 (ref 1.2–2.2)
Albumin: 4.5 g/dL (ref 3.8–4.8)
Alkaline Phosphatase: 95 IU/L (ref 44–121)
BUN/Creatinine Ratio: 24 (ref 12–28)
BUN: 17 mg/dL (ref 8–27)
Bilirubin Total: 0.3 mg/dL (ref 0.0–1.2)
CO2: 22 mmol/L (ref 20–29)
Calcium: 9.5 mg/dL (ref 8.7–10.3)
Chloride: 105 mmol/L (ref 96–106)
Creatinine, Ser: 0.7 mg/dL (ref 0.57–1.00)
GFR calc Af Amer: 102 mL/min/{1.73_m2} (ref >59)
GFR calc non Af Amer: 88 mL/min/{1.73_m2} (ref >59)
Globulin, Total: 2.5 g/dL (ref 1.5–4.5)
Glucose: 99 mg/dL (ref 65–99)
Potassium: 4.6 mmol/L (ref 3.5–5.2)
Sodium: 143 mmol/L (ref 134–144)
Total Protein: 7 g/dL (ref 6.0–8.5)

## 2020-12-21 LAB — LIPID PANEL
Chol/HDL Ratio: 3 ratio (ref 0.0–4.4)
Cholesterol, Total: 196 mg/dL (ref 100–199)
HDL: 66 mg/dL (ref >39)
LDL Chol Calc (NIH): 114 mg/dL — ABNORMAL HIGH (ref 0–99)
Triglycerides: 90 mg/dL (ref 0–149)
VLDL Cholesterol Cal: 16 mg/dL (ref 5–40)

## 2020-12-21 LAB — BAYER DCA HB A1C WAIVED: HB A1C (BAYER DCA - WAIVED): 5.8 % (ref <7.0)

## 2020-12-21 MED ORDER — ROSUVASTATIN CALCIUM 5 MG PO TABS: 5.0000 mg | ORAL_TABLET | Freq: Every day | ORAL | 3 refills | Status: DC

## 2020-12-21 NOTE — Telephone Encounter (Signed)
I have printed and placed on your desk to sign unless you want to change to a different medication.

## 2020-12-21 NOTE — Patient Instructions (Signed)
STOP Zetia 10mg  START Crestor 5mg . Take nightly as tolerated.  If you develop muscle/ joint pains, ok to take every other day.  You had labs performed today.  You will be contacted with the results of the labs once they are available, usually in the next 3 business days for routine lab work.  If you have an active my chart account, they will be released to your MyChart.  If you prefer to have these labs released to you via telephone, please let us know.  If you had a pap smear or biopsy performed, expect to be contacted in about 7-10 days.

## 2020-12-21 NOTE — Telephone Encounter (Signed)
Patient aware and rx placed up front for patient with a goodrx coupon.

## 2020-12-21 NOTE — Progress Notes (Signed)
Subjective: CC: Hyperlipidemia, prediabetes PCP: Janora Norlander, DO HKV:QQVZ Anita Kennedy is a 70 y.o. female presenting to clinic today for:  1.  Hyperlipidemia, prediabetes Patient is compliant with Zetia 10 mg daily.  She not had any joint pains or myalgia with this but does note that it is over $80 for 48-monthsupply.  She finds this quite expensive. No chest pain, shortness of breath, visual disturbance.  She has tried to remain physically active and [place].  ROS: Per HPI  Allergies  Allergen Reactions  . Pineapple Anaphylaxis  . Penicillins Hives  . Nickel Rash   Past Medical History:  Diagnosis Date  . Arthritis   . Hyperlipidemia   . Kidney stones   . Plantar fasciitis of right foot     Current Outpatient Medications:  .  acetaminophen (TYLENOL) 650 MG CR tablet, Take 1,300 mg by mouth every 8 (eight) hours as needed for pain., Disp: , Rfl:  .  cholecalciferol (VITAMIN D) 400 units TABS tablet, Take 400 Units by mouth., Disp: , Rfl:  .  ezetimibe (ZETIA) 10 MG tablet, Take 1 tablet (10 mg total) by mouth daily., Disp: 90 tablet, Rfl: 3 .  ibuprofen (ADVIL,MOTRIN) 200 MG tablet, Take 200 mg by mouth every 6 (six) hours as needed., Disp: , Rfl:  .  Krill Oil 1000 MG CAPS, Take by mouth., Disp: , Rfl:  .  valACYclovir (VALTREX) 1000 MG tablet, Take 1 tablet (1,000 mg total) by mouth 2 (two) times daily., Disp: 20 tablet, Rfl: 0 Social History   Socioeconomic History  . Marital status: Divorced    Spouse name: Not on file  . Number of children: 9  . Years of education: some college  . Highest education level: High school graduate  Occupational History  . Occupation: Retired  Tobacco Use  . Smoking status: Former Smoker    Packs/day: 0.25    Types: Cigarettes    Quit date: 10/05/2013    Years since quitting: 7.2  . Smokeless tobacco: Never Used  Vaping Use  . Vaping Use:  Never used  Substance and Sexual Activity  . Alcohol use: No  . Drug use: No  . Sexual activity: Not Currently    Birth control/protection: Post-menopausal  Other Topics Concern  . Not on file  Social History Narrative  . Not on file   Social Determinants of Health   Financial Resource Strain: Not on file  Food Insecurity: Not on file  Transportation Needs: Not on file  Physical Activity: Not on file  Stress: Not on file  Social Connections: Not on file  Intimate Partner Violence: Not on file   Family History  Problem Relation Age of Onset  . Heart disease Mother   . Alcohol abuse Father   . Early death Father   . Heart disease Father   . Hyperlipidemia Father   . Hypertension Father   . Cancer Maternal Grandmother   . Depression Maternal Grandmother   . Breast cancer Maternal Grandmother        unsure of age   . Colon cancer Neg Hx     Objective: Office vital signs reviewed. BP (!) 145/84   Pulse 77   Temp 98.3 F (36.8 C) (Temporal)   Ht '5\' 4"'  (1.626 m)   Wt 186 lb (84.4 kg)   SpO2 98%   BMI 31.93 kg/m   Physical Examination:  General:  Awake, alert, well nourished, No acute distress HEENT: Normal; no carotid bruits; sclera white Cardio: regular rate and rhythm, S1S2 heard, no murmurs appreciated Pulm: clear to auscultation bilaterally, no wheezes, rhonchi or rales; normal work of breathing on room air Extremities: warm, well perfused, No edema, cyanosis or clubbing; +2 pulses bilaterally   Assessment/ Plan: 70 y.o. female   Pure hypercholesterolemia - Plan: CMP14+EGFR, Lipid panel, Lipid panel, Hepatic Function Panel  Pre-diabetes - Plan: Bayer DCA Hb A1c Waived  We are checking liver enzymes, lipid panel today.  However, I have gone ahead and changed her to Crestor due to cost of Zetia, which is over $80 for 46-monthsupply.  We discussed pulse dosing if she is unable to tolerate daily.  May need to go back to Zetia if statin is intolerable.  She will  follow-up in 3 months for lab checkup.  A1c in prediabetic range but just barely at 5.8.  Continue ongoing lifestyle modifications, physical exercise.  Orders Placed This Encounter  Procedures  . CMP14+EGFR  . Lipid panel  . Bayer DCA Hb A1c Waived  . Lipid panel    Standing Status:   Future    Standing Expiration Date:   12/21/2021  . Hepatic Function Panel    Standing Status:   Future    Standing Expiration Date:   12/21/2021   No orders of the defined types were placed in this encounter.    AJanora Norlander DO [phone]

## 2020-12-21 NOTE — Telephone Encounter (Signed)
Less than $21 w Good Rx at [facility].  Coupon given.

## 2021-01-10 DIAGNOSIS — M1711 Unilateral primary osteoarthritis, right knee: Secondary | ICD-10-CM | POA: Diagnosis not present

## 2021-01-10 DIAGNOSIS — M25561 Pain in right knee: Secondary | ICD-10-CM | POA: Diagnosis not present

## 2021-02-22 DIAGNOSIS — Z23 Encounter for immunization: Secondary | ICD-10-CM | POA: Diagnosis not present

## 2021-05-11 ENCOUNTER — Telehealth: Payer: Self-pay | Admitting: Family Medicine

## 2021-05-12 NOTE — Telephone Encounter (Signed)
What is this for? Pt has not had her last COVID booster or shingles vaccines. What vaccines does she need to state in letter.    Pt needs to get labs at a visit with her PCP.

## 2021-05-12 NOTE — Telephone Encounter (Signed)
Lmtcb.

## 2021-05-12 NOTE — Telephone Encounter (Signed)
This has already been taken care of per patient she came in this morning and someone helped her.

## 2021-06-16 ENCOUNTER — Other Ambulatory Visit: Payer: Self-pay

## 2021-06-16 ENCOUNTER — Ambulatory Visit (INDEPENDENT_AMBULATORY_CARE_PROVIDER_SITE_OTHER): Payer: Medicare Other

## 2021-06-16 ENCOUNTER — Encounter: Payer: Self-pay | Admitting: Family Medicine

## 2021-06-16 ENCOUNTER — Ambulatory Visit (INDEPENDENT_AMBULATORY_CARE_PROVIDER_SITE_OTHER): Payer: Medicare Other | Admitting: Family Medicine

## 2021-06-16 VITALS — BP 138/72 | HR 94 | Temp 97.8°F | Ht 64.0 in | Wt 184.1 lb

## 2021-06-16 DIAGNOSIS — M25552 Pain in left hip: Secondary | ICD-10-CM

## 2021-06-16 MED ORDER — MELOXICAM 7.5 MG PO TABS: 7.5000 mg | ORAL_TABLET | Freq: Every day | ORAL | 0 refills | Status: DC

## 2021-06-16 NOTE — Patient Instructions (Signed)
Acute Back Pain, Adult Acute back pain is sudden and usually short-lived. It is often caused by an injury to the muscles and tissues in the back. The injury may result from: A muscle or ligament getting overstretched or torn (strained). Ligaments are tissues that connect bones to each other. Lifting something improperly can cause a back strain. Wear and tear (degeneration) of the spinal disks. Spinal disks are circular tissue that provide cushioning between the bones of the spine (vertebrae). Twisting motions, such as while playing sports or doing yard work. A hit to the back. Arthritis. You may have a physical exam, lab tests, and imaging tests to find the cause ofyour pain. Acute back pain usually goes away with rest and home care. Follow these instructions at home: Managing pain, stiffness, and swelling Treatment may include medicines for pain and inflammation that are taken by mouth or applied to the skin, prescription pain medicine, or muscle relaxants. Take over-the-counter and prescription medicines only as told by your health care provider. Your health care provider may recommend applying ice during the first 24-48 hours after your pain starts. To do this: Put ice in a plastic bag. Place a towel between your skin and the bag. Leave the ice on for 20 minutes, 2-3 times a day. If directed, apply heat to the affected area as often as told by your health care provider. Use the heat source that your health care provider recommends, such as a moist heat pack or a heating pad. Place a towel between your skin and the heat source. Leave the heat on for 20-30 minutes. Remove the heat if your skin turns bright red. This is especially important if you are unable to feel pain, heat, or cold. You have a greater risk of getting burned. Activity  Do not stay in bed. Staying in bed for more than 1-2 days can delay your recovery. Sit up and stand up straight. Avoid leaning forward when you sit or  hunching over when you stand. If you work at a desk, sit close to it so you do not need to lean over. Keep your chin tucked in. Keep your neck drawn back, and keep your elbows bent at a 90-degree angle (right angle). Sit high and close to the steering wheel when you drive. Add lower back (lumbar) support to your car seat, if needed. Take short walks on even surfaces as soon as you are able. Try to increase the length of time you walk each day. Do not sit, drive, or stand in one place for more than 30 minutes at a time. Sitting or standing for long periods of time can put stress on your back. Do not drive or use heavy machinery while taking prescription pain medicine. Use proper lifting techniques. When you bend and lift, use positions that put less stress on your back: Bend your knees. Keep the load close to your body. Avoid twisting. Exercise regularly as told by your health care provider. Exercising helps your back heal faster and helps prevent back injuries by keeping muscles strong and flexible. Work with a physical therapist to make a safe exercise program, as recommended by your health care provider. Do any exercises as told by your physical therapist.  Lifestyle Maintain a healthy weight. Extra weight puts stress on your back and makes it difficult to have good posture. Avoid activities or situations that make you feel anxious or stressed. Stress and anxiety increase muscle tension and can make back pain worse. Learn ways to manage   anxiety and stress, such as through exercise. General instructions Sleep on a firm mattress in a comfortable position. Try lying on your side with your knees slightly bent. If you lie on your back, put a pillow under your knees. Follow your treatment plan as told by your health care provider. This may include: Cognitive or behavioral therapy. Acupuncture or massage therapy. Meditation or yoga. Contact a health care provider if: You have pain that is not  relieved with rest or medicine. You have increasing pain going down into your legs or buttocks. Your pain does not improve after 2 weeks. You have pain at night. You lose weight without trying. You have a fever or chills. Get help right away if: You develop new bowel or bladder control problems. You have unusual weakness or numbness in your arms or legs. You develop nausea or vomiting. You develop abdominal pain. You feel faint. Summary Acute back pain is sudden and usually short-lived. Use proper lifting techniques. When you bend and lift, use positions that put less stress on your back. Take over-the-counter and prescription medicines and apply heat or ice as directed by your health care provider. This information is not intended to replace advice given to you by your health care provider. Make sure you discuss any questions you have with your healthcare provider. Document Revised: 07/13/2020 Document Reviewed: 07/16/2020 Elsevier Patient Education  2022 Elsevier Inc.  

## 2021-06-16 NOTE — Progress Notes (Signed)
Acute Office Visit  Subjective:    Patient ID: Anita Kennedy, female    DOB: 1951-04-22, 70 y.o.   MRN: [id number]  Chief Complaint  Patient presents with   Back Pain    HPI Patient is in today for lower back pain on the left side x 3 weeks. It is on a specific spot on the ilium. She reports that the pain occurs when she first stands up and with her first steps. It also happens when she gets up out of a chair or in and out of her truck. It is a sharp pain. The pain is a 5/10. Denies decreased ROM, saddle anesthesia, numbness or tingling. She has taken some ibuprofen with some relief. She is still walking 3 miles a day. She has been using a heating pad with good relief. A few months ago she was helping someone up when she fell back and caused her to fall. At the time she didn't have pain but she wonders if she injured her hip then.  Past Medical History:  Diagnosis Date   Arthritis    Hyperlipidemia    Kidney stones    Plantar fasciitis of right foot     Past Surgical History:  Procedure Laterality Date   BREAST CYST ASPIRATION Left    1990s   CESAREAN SECTION     CHOLECYSTECTOMY     COLONOSCOPY N/A 11/03/2016   Procedure: COLONOSCOPY;  Surgeon: Anita Binder, MD;  Location: [facility];  Service: Endoscopy;  Laterality: N/A;  1;00 pm   MOLE REMOVAL     TONSILLECTOMY      Family History  Problem Relation Age of Onset   Heart disease Mother    Alcohol abuse Father    Early death Father    Heart disease Father    Hyperlipidemia Father    Hypertension Father    Cancer Maternal Grandmother    Depression Maternal Grandmother    Breast cancer Maternal Grandmother        unsure of age    Colon cancer Neg Hx     Social History   Socioeconomic History   Marital status: Divorced    Spouse name: Not on file   Number of children: 9   Years of education: some college   Highest education level: High school graduate  Occupational History   Occupation: Retired  Tobacco  Use   Smoking status: Former    Packs/day: 0.25    Types: Cigarettes    Quit date: 10/05/2013    Years since quitting: 7.7   Smokeless tobacco: Never  Vaping Use   Vaping Use: Never used  Substance and Sexual Activity   Alcohol use: No   Drug use: No   Sexual activity: Not Currently    Birth control/protection: Post-menopausal  Other Topics Concern   Not on file  Social History Narrative   Not on file   Social Determinants of Health   Financial Resource Strain: Not on file  Food Insecurity: Not on file  Transportation Needs: Not on file  Physical Activity: Not on file  Stress: Not on file  Social Connections: Not on file  Intimate Partner Violence: Not on file    Outpatient Medications Prior to Visit  Medication Sig Dispense Refill   acetaminophen (TYLENOL) 650 MG CR tablet Take 1,300 mg by mouth every 8 (eight) hours as needed for pain.     cholecalciferol (VITAMIN D) 400 units TABS tablet Take 400 Units by mouth.  ibuprofen (ADVIL,MOTRIN) 200 MG tablet Take 200 mg by mouth every 6 (six) hours as needed.     Krill Oil 1000 MG CAPS Take by mouth.     rosuvastatin (CRESTOR) 5 MG tablet Take 1 tablet (5 mg total) by mouth daily. 90 tablet 3   valACYclovir (VALTREX) 1000 MG tablet Take 1 tablet (1,000 mg total) by mouth 2 (two) times daily. (Patient not taking: Reported on 06/16/2021) 20 tablet 0   No facility-administered medications prior to visit.    Allergies  Allergen Reactions   Penicillins Hives   Nickel Rash    Review of Systems As per HPI.    Objective:    Physical Exam Vitals and nursing note reviewed.  Constitutional:      General: She is not in acute distress.    Appearance: She is not ill-appearing, toxic-appearing or diaphoretic.  Pulmonary:     Effort: Pulmonary effort is normal. No respiratory distress.  Musculoskeletal:     Lumbar back: No swelling or deformity. Negative right straight leg raise test.     Right hip: No deformity. Normal  range of motion.     Right lower leg: No edema.     Left lower leg: No edema.     Comments: Tenderness at mid ilium of left side. No other tenderness noted.   Skin:    General: Skin is warm and dry.  Neurological:     General: No focal deficit present.     Mental Status: She is alert and oriented to person, place, and time.  Psychiatric:        Mood and Affect: Mood normal.        Behavior: Behavior normal.    BP 138/72   Pulse 94   Temp 97.8 F (36.6 C) (Temporal)   Ht '5\' 4"'$  (1.626 m)   Wt 184 lb 2 oz (83.5 kg)   BMI 31.60 kg/m  Wt Readings from Last 3 Encounters:  06/16/21 184 lb 2 oz (83.5 kg)  12/21/20 186 lb (84.4 kg)  05/04/20 182 lb (82.6 kg)    Health Maintenance Due  Topic Date Due   Zoster Vaccines- Shingrix (1 of 2) Never done   COVID-19 Vaccine (4 - Booster for Moderna series) 01/11/2021    There are no preventive care reminders to display for this patient.   Lab Results  Component Value Date   TSH 1.110 05/04/2020   No results found for: WBC, HGB, HCT, MCV, PLT Lab Results  Component Value Date   NA 143 12/21/2020   K 4.6 12/21/2020   CO2 22 12/21/2020   GLUCOSE 99 12/21/2020   BUN 17 12/21/2020   CREATININE 0.70 12/21/2020   BILITOT 0.3 12/21/2020   ALKPHOS 95 12/21/2020   AST 18 12/21/2020   ALT 17 12/21/2020   PROT 7.0 12/21/2020   ALBUMIN 4.5 12/21/2020   CALCIUM 9.5 12/21/2020   Lab Results  Component Value Date   CHOL 196 12/21/2020   Lab Results  Component Value Date   HDL 66 12/21/2020   Lab Results  Component Value Date   LDLCALC 114 (H) 12/21/2020   Lab Results  Component Value Date   TRIG 90 12/21/2020   Lab Results  Component Value Date   CHOLHDL 3.0 12/21/2020   Lab Results  Component Value Date   HGBA1C 5.8 12/21/2020       Assessment & Plan:   Anita Kennedy was seen today for back pain.  Diagnoses and all orders for this visit:  Acute hip pain, left Xray today in office, radiology report pending. Mobic daily  as needed, do not take other NSAIDs with mobic. Discussed rest, heat, brace. Return to office for new or worsening symptoms, or if symptoms persist.  -     meloxicam (MOBIC) 7.5 MG tablet; Take 1 tablet (7.5 mg total) by mouth daily. -     DG HIP UNILAT W OR W/O PELVIS 2-3 VIEWS LEFT; Future  The patient indicates understanding of these issues and agrees with the plan.  Gwenlyn Perking, FNP

## 2021-06-20 ENCOUNTER — Telehealth: Payer: Self-pay | Admitting: Family Medicine

## 2021-06-20 NOTE — Telephone Encounter (Signed)
Exercises printed. Pt aware to come to office and pick up.

## 2021-07-02 DIAGNOSIS — Z23 Encounter for immunization: Secondary | ICD-10-CM | POA: Diagnosis not present

## 2021-07-04 ENCOUNTER — Telehealth: Payer: Self-pay | Admitting: Family Medicine

## 2021-07-04 NOTE — Telephone Encounter (Signed)
lmtcb

## 2021-07-13 NOTE — Telephone Encounter (Signed)
No call back will close encounter

## 2021-10-03 ENCOUNTER — Encounter: Payer: Self-pay | Admitting: Family Medicine

## 2021-10-03 ENCOUNTER — Ambulatory Visit (INDEPENDENT_AMBULATORY_CARE_PROVIDER_SITE_OTHER): Payer: Medicare Other | Admitting: Family Medicine

## 2021-10-03 VITALS — BP 135/69 | HR 75 | Temp 98.2°F | Ht 64.0 in | Wt 188.0 lb

## 2021-10-03 DIAGNOSIS — M1712 Unilateral primary osteoarthritis, left knee: Secondary | ICD-10-CM

## 2021-10-03 MED ORDER — METHYLPREDNISOLONE ACETATE 40 MG/ML IJ SUSP
40.0000 mg | Freq: Once | INTRAMUSCULAR | Status: AC
Start: 2021-10-03 — End: 2021-10-03
  Administered 2021-10-03: 12:00:00 40 mg via INTRA_ARTICULAR

## 2021-10-03 MED ORDER — METHYLPREDNISOLONE ACETATE 40 MG/ML IJ SUSP: 40.0000 mg | Freq: Once | INTRAMUSCULAR | Status: DC

## 2021-10-03 MED ORDER — MELOXICAM 7.5 MG PO TABS
7.5000 mg | ORAL_TABLET | Freq: Every day | ORAL | 0 refills | Status: DC
Start: 2021-10-03 — End: 2021-10-19

## 2021-10-03 NOTE — Patient Instructions (Signed)
Arthritis Arthritis is a term that is commonly used to refer to joint pain or jointdisease. There are more than 100 types of arthritis. What are the causes? The most common cause of this condition is wear and tear of a joint. Other causes include: Gout. Inflammation of a joint. An infection of a joint. Sprains and other injuries near the joint. A reaction to medicines or drugs, or an allergic reaction. In some cases, the cause may not be known. What are the signs or symptoms? The main symptom of this condition is pain in the joint during movement. Other symptoms include: Redness, swelling, or stiffness at a joint. Warmth coming from the joint. Fever. Overall feeling of illness. How is this diagnosed? This condition may be diagnosed with a physical exam and tests, including: Blood tests. Urine tests. Imaging tests, such as X-rays, an MRI, or a CT scan. Sometimes, fluid is removed from a joint for testing. How is this treated? This condition may be treated with: Treatment of the cause, if it is known. Rest. Raising (elevating) the joint. Applying cold or hot packs to the joint. Medicines to improve symptoms and reduce inflammation. Injections of a steroid such as cortisone into the joint to help reduce pain and inflammation. Depending on the cause of your arthritis, you may need to make lifestyle changes to reduce stress on your joint. Changes may include: Exercising more. Losing weight. Follow these instructions at home: Medicines Take over-the-counter and prescription medicines only as told by your health care provider. Do not take aspirin to relieve pain if your health care provider thinks that gout may be causing your pain. Activity Rest your joint if told by your health care provider. Rest is important when your disease is active and your joint feels painful, swollen, or stiff. Avoid activities that make the pain worse. It is important to balance activity with  rest. Exercise your joint regularly with range-of-motion exercises as told by your health care provider. Try doing low-impact exercise, such as: Swimming. Water aerobics. Biking. Walking. Managing pain, stiffness, and swelling     If directed, put ice on the joint. Put ice in a plastic bag. Place a towel between your skin and the bag. Leave the ice on for 20 minutes, 2-3 times per day. If your joint is swollen, raise (elevate) it above the level of your heart if directed by your health care provider. If your joint feels stiff in the morning, try taking a warm shower. If directed, apply heat to the affected area as often as told by your health care provider. Use the heat source that your health care provider recommends, such as a moist heat pack or a heating pad. If you have diabetes, do not apply heat without permission from your health care provider. To apply heat: Place a towel between your skin and the heat source. Leave the heat on for 20-30 minutes. Remove the heat if your skin turns bright red. This is especially important if you are unable to feel pain, heat, or cold. You may have a greater risk of getting burned. General instructions Do not use any products that contain nicotine or tobacco, such as cigarettes, e-cigarettes, and chewing tobacco. If you need help quitting, ask your health care provider. Keep all follow-up visits as told by your health care provider. This is important. Contact a health care provider if: The pain gets worse. You have a fever. Get help right away if: You develop severe joint pain, swelling, or redness. Many joints   become painful and swollen. You develop severe back pain. You develop severe weakness in your leg. You cannot control your bladder or bowels. Summary Arthritis is a term that is commonly used to refer to joint pain or joint disease. There are more than 100 types of arthritis. The most common cause of this condition is wear and tear of a  joint. Other causes include gout, inflammation or infection of the joint, sprains, or allergies. Symptoms of this condition include redness, swelling, or stiffness of the joint. Other symptoms include warmth, fever, or feeling ill. This condition is treated with rest, elevation, medicines, and applying cold or hot packs. Follow your health care provider's instructions about medicines, activity, exercises, and other home care treatments. This information is not intended to replace advice given to you by your health care provider. Make sure you discuss any questions you have with your healthcare provider. Document Revised: 09/30/2018 Document Reviewed: 09/30/2018 Elsevier Patient Education  2022 Else[employer] Inc.  

## 2021-10-03 NOTE — Progress Notes (Signed)
Acute Office Visit  Subjective:    Patient ID: Anita Kennedy, female    DOB: April 10, 1951, 70 y.o.   MRN: [id number]  Chief Complaint  Patient presents with   Knee Pain    HPI Patient is in today for left knee pain x 10 days. She has a history of arthritis in this knee. The pain is a 5 during the day and an 8 at night. The pain is achy with intermittent sharp pains. The pain is worse with activity. The knee gets stiff with rest. She denies erythema or edema. It does feel a little swollen to her. She denies fever or injury. She has had injections in this knee before but it has been years. She has an orthopedic but they are not able to see here for the next few weeks.   Past Medical History:  Diagnosis Date   Arthritis    Hyperlipidemia    Kidney stones    Plantar fasciitis of right foot     Past Surgical History:  Procedure Laterality Date   BREAST CYST ASPIRATION Left    1990s   CESAREAN SECTION     CHOLECYSTECTOMY     COLONOSCOPY N/A 11/03/2016   Procedure: COLONOSCOPY;  Surgeon: Danie Binder, MD;  Location: [facility];  Service: Endoscopy;  Laterality: N/A;  1;00 pm   MOLE REMOVAL     TONSILLECTOMY      Family History  Problem Relation Age of Onset   Heart disease Mother    Alcohol abuse Father    Early death Father    Heart disease Father    Hyperlipidemia Father    Hypertension Father    Cancer Maternal Grandmother    Depression Maternal Grandmother    Breast cancer Maternal Grandmother        unsure of age    Colon cancer Neg Hx     Social History   Socioeconomic History   Marital status: Divorced    Spouse name: Not on file   Number of children: 9   Years of education: some college   Highest education level: High school graduate  Occupational History   Occupation: Retired  Tobacco Use   Smoking status: Former    Packs/day: 0.25    Types: Cigarettes    Quit date: 10/05/2013    Years since quitting: 8.0   Smokeless tobacco: Never  Vaping Use    Vaping Use: Never used  Substance and Sexual Activity   Alcohol use: No   Drug use: No   Sexual activity: Not Currently    Birth control/protection: Post-menopausal  Other Topics Concern   Not on file  Social History Narrative   Not on file   Social Determinants of Health   Financial Resource Strain: Not on file  Food Insecurity: Not on file  Transportation Needs: Not on file  Physical Activity: Not on file  Stress: Not on file  Social Connections: Not on file  Intimate Partner Violence: Not on file    Outpatient Medications Prior to Visit  Medication Sig Dispense Refill   acetaminophen (TYLENOL) 650 MG CR tablet Take 1,300 mg by mouth every 8 (eight) hours as needed for pain.     cholecalciferol (VITAMIN D) 400 units TABS tablet Take 400 Units by mouth.     ibuprofen (ADVIL,MOTRIN) 200 MG tablet Take 200 mg by mouth every 6 (six) hours as needed.     rosuvastatin (CRESTOR) 5 MG tablet Take 1 tablet (5 mg total) by  mouth daily. 90 tablet 3   valACYclovir (VALTREX) 1000 MG tablet Take 1 tablet (1,000 mg total) by mouth 2 (two) times daily. 20 tablet 0   Krill Oil 1000 MG CAPS Take by mouth. (Patient not taking: Reported on 10/03/2021)     meloxicam (MOBIC) 7.5 MG tablet Take 1 tablet (7.5 mg total) by mouth daily. (Patient not taking: Reported on 10/03/2021) 30 tablet 0   No facility-administered medications prior to visit.    Allergies  Allergen Reactions   Penicillins Hives   Nickel Rash    Review of Systems As per HPI.     Objective:    Physical Exam Vitals and nursing note reviewed.  Constitutional:      General: She is not in acute distress.    Appearance: She is not ill-appearing, toxic-appearing or diaphoretic.  Pulmonary:     Effort: Pulmonary effort is normal. No respiratory distress.  Musculoskeletal:     Left knee: Swelling (mild generalized) present. No deformity, effusion, erythema or bony tenderness. Normal range of motion. Tenderness  (generalized) present. Normal alignment.     Right lower leg: No edema.     Left lower leg: No edema.  Skin:    General: Skin is warm and dry.  Neurological:     Mental Status: She is alert and oriented to person, place, and time.  Psychiatric:        Mood and Affect: Mood normal.        Behavior: Behavior normal.    BP 135/69 Comment: at home reading per pt  Pulse 75   Temp 98.2 F (36.8 C) (Temporal)   Ht 5\' 4"  (1.626 m)   Wt 188 lb (85.3 kg)   BMI 32.27 kg/m  Wt Readings from Last 3 Encounters:  10/03/21 188 lb (85.3 kg)  06/16/21 184 lb 2 oz (83.5 kg)  12/21/20 186 lb (84.4 kg)   Joint Injection/Arthrocentesis  Date/Time: 10/03/2021 11:35 AM Performed by: Gwenlyn Perking, FNP Authorized by: Gwenlyn Perking, FNP  Indications: pain  Body area: knee Joint: left knee Local anesthesia used: yes  Anesthesia: Local anesthesia used: yes Local Anesthetic: lidocaine spray  Sedation: Patient sedated: no  Needle gauge: 25 G. Ultrasound guidance: no Approach: anterolateral. Lidocaine 2% amount: 1 (with depo medrol 40 mg in 1 ml) mL Patient tolerance: patient tolerated the procedure well with no immediate complications     Health Maintenance Due  Topic Date Due   Zoster Vaccines- Shingrix (1 of 2) Never done   COVID-19 Vaccine (4 - Booster for Moderna series) 11/08/2020    There are no preventive care reminders to display for this patient.   Lab Results  Component Value Date   TSH 1.110 05/04/2020   No results found for: WBC, HGB, HCT, MCV, PLT Lab Results  Component Value Date   NA 143 12/21/2020   K 4.6 12/21/2020   CO2 22 12/21/2020   GLUCOSE 99 12/21/2020   BUN 17 12/21/2020   CREATININE 0.70 12/21/2020   BILITOT 0.3 12/21/2020   ALKPHOS 95 12/21/2020   AST 18 12/21/2020   ALT 17 12/21/2020   PROT 7.0 12/21/2020   ALBUMIN 4.5 12/21/2020   CALCIUM 9.5 12/21/2020   Lab Results  Component Value Date   CHOL 196 12/21/2020   Lab Results   Component Value Date   HDL 66 12/21/2020   Lab Results  Component Value Date   LDLCALC 114 (H) 12/21/2020   Lab Results  Component Value Date  TRIG 90 12/21/2020   Lab Results  Component Value Date   CHOLHDL 3.0 12/21/2020   Lab Results  Component Value Date   HGBA1C 5.8 12/21/2020       Assessment & Plan:   Mardel was seen today for knee pain.  Diagnoses and all orders for this visit:  Primary osteoarthritis of left knee Chronic with worsening for last 10 days. No sings of septic joint. Denies injury. Left knee joint injection today in office. Tolerated well without complications. Mobic ordered as needed, do not take other NSAIDs with mobic.  -     meloxicam (MOBIC) 7.5 MG tablet; Take 1 tablet (7.5 mg total) by mouth daily. -     Joint Injection/Arthrocentesis -     methylPREDNISolone acetate (DEPO-MEDROL) injection 40 mg  Return to office for new or worsening symptoms, or if symptoms persist.   The patient indicates understanding of these issues and agrees with the plan.  Gwenlyn Perking, FNP

## 2021-10-12 ENCOUNTER — Ambulatory Visit (INDEPENDENT_AMBULATORY_CARE_PROVIDER_SITE_OTHER): Payer: Medicare Other

## 2021-10-12 ENCOUNTER — Other Ambulatory Visit: Payer: Self-pay

## 2021-10-12 ENCOUNTER — Encounter: Payer: Self-pay | Admitting: Orthopaedic Surgery

## 2021-10-12 ENCOUNTER — Ambulatory Visit (INDEPENDENT_AMBULATORY_CARE_PROVIDER_SITE_OTHER): Payer: Medicare Other | Admitting: Orthopaedic Surgery

## 2021-10-12 DIAGNOSIS — G8929 Other chronic pain: Secondary | ICD-10-CM

## 2021-10-12 DIAGNOSIS — M25562 Pain in left knee: Secondary | ICD-10-CM

## 2021-10-12 NOTE — Progress Notes (Signed)
Office Visit Note   Patient: Anita Kennedy           Date of Birth: 06-09-1951           MRN: [id number] Visit Date: 10/12/2021              Requested by: Janora Norlander, DO [place],  [place] [id number] PCP: Janora Norlander, DO   Assessment & Plan: Visit Diagnoses:  1. Chronic pain of left knee     Plan: Patient with history of arthritis in her left knee.  Recent flare in the last 2 weeks.  Especially painful over the lateral patellar border.  She has significant degenerative changes especially patellofemoral joint on x-ray.  She did have a steroid injection by her primary care provider 8 days ago.  She does not think it helped her much.  She is on meloxicam 7.5 mg.  We did say if she does not use Advil or any other anti-inflammatories she could realistically take this twice a day with food.  She does have an effusion however her motion is at 120 degrees of flexion and since we could not inject steroid may not be worth aspirating her knee today.  She is agree with that plan.  We will follow-up in 6 to 8 weeks if she wishes to consider another injection.  Also encouraged using her knee support  Follow-Up Instructions: No follow-ups on file.   Orders:  Orders Placed This Encounter  Procedures   XR KNEE 3 VIEW LEFT   No orders of the defined types were placed in this encounter.     Procedures: No procedures performed   Clinical Data: No additional findings.   Subjective: Chief Complaint  Patient presents with   Left Knee - Pain  Patient presents today for left knee pain. She said that it started to hurt a couple weeks ago. No known injury. She said that her pain is lateral. She feels like her knee is tight. She walks with a limp. She saw her PCP last week and was given a knee injection and Meloxicam. She said that she has noticed no improvement with the Meloxicam.     Review of Systems  All other systems reviewed and are negative.   Objective: Vital  Signs: There were no vitals taken for this visit.  Physical Exam Constitutional:      Appearance: Normal appearance.  Neurological:     Mental Status: She is alert.  Psychiatric:        Mood and Affect: Mood normal.        Behavior: Behavior normal.    Ortho Exam Examination of her left knee good varus valgus stability.  She does have some mild warmth but no cellulitis.  She does have mild to moderate effusion but is able to bend her leg to 120 degrees.  Tender over the lateral patellar border and mildly over the lateral joint line.  Minimal tenderness over the medial joint line. Specialty Comments:  No specialty comments available.  Imaging: No results found.   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   Obesity 10/08/2017   Pre-diabetes 10/08/2017   Special screening for malignant neoplasms, colon    Hyperlipidemia 10/05/2016   Hyperglycemia 10/05/2016   Elevated blood pressure reading 10/05/2016   Past Medical History:  Diagnosis Date   Arthritis    Hyperlipidemia  Kidney stones    Plantar fasciitis of right foot     Family History  Problem Relation Age of Onset   Heart disease Mother    Alcohol abuse Father    Early death Father    Heart disease Father    Hyperlipidemia Father    Hypertension Father    Cancer Maternal Grandmother    Depression Maternal Grandmother    Breast cancer Maternal Grandmother        unsure of age    Colon cancer Neg Hx     Past Surgical History:  Procedure Laterality Date   BREAST CYST ASPIRATION Left    1990s   CESAREAN SECTION     CHOLECYSTECTOMY     COLONOSCOPY N/A 11/03/2016   Procedure: COLONOSCOPY;  Surgeon: Danie Binder, MD;  Location: [facility];  Service: Endoscopy;  Laterality: N/A;  1;00 pm   MOLE REMOVAL     TONSILLECTOMY     Social History   Occupational History   Occupation: Retired  Tobacco Use   Smoking  status: Former    Packs/day: 0.25    Types: Cigarettes    Quit date: 10/05/2013    Years since quitting: 8.0   Smokeless tobacco: Never  Vaping Use   Vaping Use: Never used  Substance and Sexual Activity   Alcohol use: No   Drug use: No   Sexual activity: Not Currently    Birth control/protection: Post-menopausal

## 2021-10-15 DIAGNOSIS — Z23 Encounter for immunization: Secondary | ICD-10-CM | POA: Diagnosis not present

## 2021-10-15 DIAGNOSIS — U071 COVID-19: Secondary | ICD-10-CM | POA: Diagnosis not present

## 2021-10-17 ENCOUNTER — Telehealth: Payer: Self-pay | Admitting: Orthopaedic Surgery

## 2021-10-17 ENCOUNTER — Telehealth: Payer: Self-pay | Admitting: Family Medicine

## 2021-10-17 DIAGNOSIS — M1712 Unilateral primary osteoarthritis, left knee: Secondary | ICD-10-CM

## 2021-10-17 NOTE — Telephone Encounter (Signed)
Patient was prescribed Meloxicam 7.5 mg tabs by her PCP. Dr. Durward Fortes advised her to take 2 tabs. She is requesting refill but her pharmacy stated they are not allowed to refill due to Dr. Durward Fortes changing the order.

## 2021-10-17 NOTE — Telephone Encounter (Signed)
Spoke with patient. Relayed information and she will call her PCP for refills.

## 2021-10-17 NOTE — Telephone Encounter (Signed)
Have her contact ortho please. I did not see that in the note from ortho.

## 2021-10-17 NOTE — Telephone Encounter (Signed)
Tiffany is the prescriber of the meloxicam.  Do you want to increase or should pt contact ortho since they made the changes>

## 2021-10-17 NOTE — Telephone Encounter (Signed)
  Prescription Request  10/17/2021  Is this a "Controlled Substance" medicine? no  Have you seen your PCP in the last 2 weeks? 10/03/2021  If YES, route message to pool  -  If NO, patient needs to be scheduled for appointment.  What is the name of the medication or equipment? meloxicam (MOBIC) 7.5 MG tablet  Have you contacted your pharmacy to request a refill? No-no more refills. Pt seen the orthopedic and was advised to increase to one tablet two times a day but he did not give her a refill. She did not try to contact ortho.  Which pharmacy would you like this sent to? [facility]   Patient notified that their request is being sent to the clinical staff for review and that they should receive a response within 2 business days.

## 2021-10-17 NOTE — Telephone Encounter (Signed)
Really needs to have that prescribed by the physician who originally prescribed the med-her PCP

## 2021-10-17 NOTE — Telephone Encounter (Signed)
Pt informed and will call back if needed.

## 2021-10-19 ENCOUNTER — Encounter: Payer: Self-pay | Admitting: Family Medicine

## 2021-10-19 ENCOUNTER — Ambulatory Visit (INDEPENDENT_AMBULATORY_CARE_PROVIDER_SITE_OTHER): Payer: Medicare Other | Admitting: Family Medicine

## 2021-10-19 VITALS — BP 135/78 | HR 99 | Temp 97.7°F | Ht 64.0 in | Wt 184.2 lb

## 2021-10-19 DIAGNOSIS — M1712 Unilateral primary osteoarthritis, left knee: Secondary | ICD-10-CM

## 2021-10-19 LAB — BMP8+EGFR
BUN/Creatinine Ratio: 25 (ref 12–28)
BUN: 21 mg/dL (ref 8–27)
CO2: 22 mmol/L (ref 20–29)
Calcium: 9.8 mg/dL (ref 8.7–10.3)
Chloride: 103 mmol/L (ref 96–106)
Creatinine, Ser: 0.83 mg/dL (ref 0.57–1.00)
Glucose: 106 mg/dL — ABNORMAL HIGH (ref 70–99)
Potassium: 4.5 mmol/L (ref 3.5–5.2)
Sodium: 140 mmol/L (ref 134–144)
eGFR: 75 mL/min/{1.73_m2} (ref >59)

## 2021-10-19 MED ORDER — MELOXICAM 7.5 MG PO TABS
7.5000 mg | ORAL_TABLET | Freq: Two times a day (BID) | ORAL | 1 refills | Status: DC
Start: 2021-10-19 — End: 2022-02-06

## 2021-10-19 NOTE — Patient Instructions (Signed)
Arthritis Arthritis is a term that is commonly used to refer to joint pain or jointdisease. There are more than 100 types of arthritis. What are the causes? The most common cause of this condition is wear and tear of a joint. Other causes include: Gout. Inflammation of a joint. An infection of a joint. Sprains and other injuries near the joint. A reaction to medicines or drugs, or an allergic reaction. In some cases, the cause may not be known. What are the signs or symptoms? The main symptom of this condition is pain in the joint during movement. Other symptoms include: Redness, swelling, or stiffness at a joint. Warmth coming from the joint. Fever. Overall feeling of illness. How is this diagnosed? This condition may be diagnosed with a physical exam and tests, including: Blood tests. Urine tests. Imaging tests, such as X-rays, an MRI, or a CT scan. Sometimes, fluid is removed from a joint for testing. How is this treated? This condition may be treated with: Treatment of the cause, if it is known. Rest. Raising (elevating) the joint. Applying cold or hot packs to the joint. Medicines to improve symptoms and reduce inflammation. Injections of a steroid such as cortisone into the joint to help reduce pain and inflammation. Depending on the cause of your arthritis, you may need to make lifestyle changes to reduce stress on your joint. Changes may include: Exercising more. Losing weight. Follow these instructions at home: Medicines Take over-the-counter and prescription medicines only as told by your health care provider. Do not take aspirin to relieve pain if your health care provider thinks that gout may be causing your pain. Activity Rest your joint if told by your health care provider. Rest is important when your disease is active and your joint feels painful, swollen, or stiff. Avoid activities that make the pain worse. It is important to balance activity with  rest. Exercise your joint regularly with range-of-motion exercises as told by your health care provider. Try doing low-impact exercise, such as: Swimming. Water aerobics. Biking. Walking. Managing pain, stiffness, and swelling     If directed, put ice on the joint. Put ice in a plastic bag. Place a towel between your skin and the bag. Leave the ice on for 20 minutes, 2-3 times per day. If your joint is swollen, raise (elevate) it above the level of your heart if directed by your health care provider. If your joint feels stiff in the morning, try taking a warm shower. If directed, apply heat to the affected area as often as told by your health care provider. Use the heat source that your health care provider recommends, such as a moist heat pack or a heating pad. If you have diabetes, do not apply heat without permission from your health care provider. To apply heat: Place a towel between your skin and the heat source. Leave the heat on for 20-30 minutes. Remove the heat if your skin turns bright red. This is especially important if you are unable to feel pain, heat, or cold. You may have a greater risk of getting burned. General instructions Do not use any products that contain nicotine or tobacco, such as cigarettes, e-cigarettes, and chewing tobacco. If you need help quitting, ask your health care provider. Keep all follow-up visits as told by your health care provider. This is important. Contact a health care provider if: The pain gets worse. You have a fever. Get help right away if: You develop severe joint pain, swelling, or redness. Many joints   become painful and swollen. You develop severe back pain. You develop severe weakness in your leg. You cannot control your bladder or bowels. Summary Arthritis is a term that is commonly used to refer to joint pain or joint disease. There are more than 100 types of arthritis. The most common cause of this condition is wear and tear of a  joint. Other causes include gout, inflammation or infection of the joint, sprains, or allergies. Symptoms of this condition include redness, swelling, or stiffness of the joint. Other symptoms include warmth, fever, or feeling ill. This condition is treated with rest, elevation, medicines, and applying cold or hot packs. Follow your health care provider's instructions about medicines, activity, exercises, and other home care treatments. This information is not intended to replace advice given to you by your health care provider. Make sure you discuss any questions you have with your healthcare provider. Document Revised: 09/30/2018 Document Reviewed: 09/30/2018 Elsevier Patient Education  2022 Else[employer] Inc.  

## 2021-10-19 NOTE — Progress Notes (Signed)
Established Patient Office Visit  Subjective:  Patient ID: Anita Kennedy, female    DOB: 31-Jul-1951  Age: 70 y.o. MRN: [id number]  CC:  Chief Complaint  Patient presents with   Knee Pain    HPI Anita Kennedy presents for left knee pain due to arthritis. She was seen on 10/03/21 and started on mobic 7.5 mg daily and given a steroid joint injection. This did not help to relieve her pain. She was then seen by ortho on 10/12/21. She was instructed by otho to take mobic 7.5 mg BID. They did not send in a prescription for this and instructed her to follow up with her PCP regarding this. She has been taking mobic BID and reports this does help enough to allow her to get through her day and to sleep at night. She has also been wearing a knee brace. The swelling her knee has improved. She denies fever, erythema, or warmth to the joint. The need for a knee replacement was discussed at her visit with ortho.   Past Medical History:  Diagnosis Date   Arthritis    Hyperlipidemia    Kidney stones    Plantar fasciitis of right foot     Past Surgical History:  Procedure Laterality Date   BREAST CYST ASPIRATION Left    1990s   CESAREAN SECTION     CHOLECYSTECTOMY     COLONOSCOPY N/A 11/03/2016   Procedure: COLONOSCOPY;  Surgeon: Danie Binder, MD;  [facility]: [facility];  Service: Endoscopy;  Laterality: N/A;  1;00 pm   MOLE REMOVAL     TONSILLECTOMY      Family History  Problem Relation Age of Onset   Heart disease Mother    Alcohol abuse Father    Early death Father    Heart disease Father    Hyperlipidemia Father    Hypertension Father    Cancer Maternal Grandmother    Depression Maternal Grandmother    Breast cancer Maternal Grandmother        unsure of age    Colon cancer Neg Hx     Social History   Socioeconomic History   Marital status: Divorced    Spouse name: Not on file   Number of children: 9   Years of education: some college   Highest education level: High  school graduate  Occupational History   Occupation: Retired  Tobacco Use   Smoking status: Former    Packs/day: 0.25    Types: Cigarettes    Quit date: 10/05/2013    Years since quitting: 8.0   Smokeless tobacco: Never  Vaping Use   Vaping Use: Never used  Substance and Sexual Activity   Alcohol use: No   Drug use: No   Sexual activity: Not Currently    Birth control/protection: Post-menopausal  Other Topics Concern   Not on file  Social History Narrative   Not on file   Social Determinants of Health   Financial Resource Strain: Not on file  Food Insecurity: Not on file  Transportation Needs: Not on file  Physical Activity: Not on file  Stress: Not on file  Social Connections: Not on file  Intimate Partner Violence: Not on file    Outpatient Medications Prior to Visit  Medication Sig Dispense Refill   acetaminophen (TYLENOL) 650 MG CR tablet Take 1,300 mg by mouth every 8 (eight) hours as needed for pain.     cholecalciferol (VITAMIN D) 400 units TABS tablet Take 400 Units by  mouth.     Krill Oil 1000 MG CAPS Take by mouth.     rosuvastatin (CRESTOR) 5 MG tablet Take 1 tablet (5 mg total) by mouth daily. 90 tablet 3   valACYclovir (VALTREX) 1000 MG tablet Take 1 tablet (1,000 mg total) by mouth 2 (two) times daily. 20 tablet 0   meloxicam (MOBIC) 7.5 MG tablet Take 1 tablet (7.5 mg total) by mouth daily. 30 tablet 0   ibuprofen (ADVIL,MOTRIN) 200 MG tablet Take 200 mg by mouth every 6 (six) hours as needed. (Patient not taking: Reported on 10/19/2021)     No facility-administered medications prior to visit.    Allergies  Allergen Reactions   Penicillins Hives   Nickel Rash    ROS Review of Systems As per HPI.    Objective:    Physical Exam Vitals and nursing note reviewed.  Constitutional:      General: She is not in acute distress.    Appearance: She is not ill-appearing or toxic-appearing.  HENT:     Head: Normocephalic and atraumatic.  Pulmonary:      Effort: Pulmonary effort is normal. No respiratory distress.  Musculoskeletal:     Left knee: Swelling (mild, generalized) present. No erythema. Tenderness (generalized) present. Normal patellar mobility.     Right lower leg: No edema.     Left lower leg: No edema.  Neurological:     General: No focal deficit present.     Mental Status: She is alert and oriented to person, place, and time.  Psychiatric:        Mood and Affect: Mood normal.        Behavior: Behavior normal.    BP 135/78    Pulse 99    Temp 97.7 F (36.5 C) (Temporal)    Ht _0  (1.626 m)    Wt 184 lb 4 oz (83.6 kg)    BMI 31.63 kg/m  Wt Readings from Last 3 Encounters:  10/19/21 184 lb 4 oz (83.6 kg)  10/03/21 188 lb (85.3 kg)  06/16/21 184 lb 2 oz (83.5 kg)     Health Maintenance Due  Topic Date Due   COVID-19 Vaccine (4 - Booster for Moderna series) 11/08/2020    There are no preventive care reminders to display for this patient.  Lab Results  Component Value Date   TSH 1.110 05/04/2020   No results found for: WBC, HGB, HCT, MCV, PLT Lab Results  Component Value Date   NA 143 12/21/2020   K 4.6 12/21/2020   CO2 22 12/21/2020   GLUCOSE 99 12/21/2020   BUN 17 12/21/2020   CREATININE 0.70 12/21/2020   BILITOT 0.3 12/21/2020   ALKPHOS 95 12/21/2020   AST 18 12/21/2020   ALT 17 12/21/2020   PROT 7.0 12/21/2020   ALBUMIN 4.5 12/21/2020   CALCIUM 9.5 12/21/2020   Lab Results  Component Value Date   CHOL 196 12/21/2020   Lab Results  Component Value Date   HDL 66 12/21/2020   Lab Results  Component Value Date   LDLCALC 114 (H) 12/21/2020   Lab Results  Component Value Date   TRIG 90 12/21/2020   Lab Results  Component Value Date   CHOLHDL 3.0 12/21/2020   Lab Results  Component Value Date   HGBA1C 5.8 12/21/2020      Assessment & Plan:   Anita Kennedy was seen today for knee pain.  Diagnoses and all orders for this visit:  Primary osteoarthritis of left knee Mobic ordered  BID as  instructed by ortho. BMP pending to reassess kidney function. Discussed if kidney function as declined will back down to once a daily for mobic. Continue wearing brace. Follow up with ortho as needed.  -     meloxicam (MOBIC) 7.5 MG tablet; Take 1 tablet (7.5 mg total) by mouth 2 (two) times daily. -     BMP8+EGFR  Follow-up: As needed.   The patient indicates understanding of these issues and agrees with the plan.   Gwenlyn Perking, FNP

## 2021-11-03 ENCOUNTER — Encounter: Payer: Self-pay | Admitting: *Deleted

## 2021-12-05 ENCOUNTER — Ambulatory Visit (INDEPENDENT_AMBULATORY_CARE_PROVIDER_SITE_OTHER): Payer: Medicare Other

## 2021-12-05 VITALS — Ht 65.0 in | Wt 184.0 lb

## 2021-12-05 DIAGNOSIS — Z0000 Encounter for general adult medical examination without abnormal findings: Secondary | ICD-10-CM | POA: Diagnosis not present

## 2021-12-05 NOTE — Patient Instructions (Signed)
Anita Kennedy , Thank you for taking time to come for your Medicare Wellness Visit. I appreciate your ongoing commitment to your health goals. Please review the following plan we discussed and let me know if I can assist you in the future.   Screening recommendations/referrals: Colonoscopy: Done 11/03/2016 Repeat in 10 years  Mammogram: Done 12/21/2020 Repeat annually  Bone Density: Done 08/13/2017 Repeat every 2 years  Recommended yearly ophthalmology/optometry visit for glaucoma screening and checkup Recommended yearly dental visit for hygiene and checkup  Vaccinations: Influenza vaccine: Done 06/2021 Repeat annually  Pneumococcal vaccine: Done 10/05/2016 and 10/08/2017 Tdap vaccine: Done 02/02/2020 Repeat in 10 years  Shingles vaccine: Zoster done 10/05/2016. Shingrix discussed. Please contact your pharmacy for coverage information.     Covid-19:Done 02/04/2020, 03/03/2020, 09/13/2020 and 10/15/2021.  Advanced directives: Advance directive discussed with you today. Even though you declined this today, please call our office should you change your mind, and we can give you the proper paperwork for you to fill out.   Conditions/risks identified: KEEP UP THE GOOD WORK!!  Next appointment: Follow up in one year for your annual wellness visit 2024.   Preventive Care 72 Years and Older, Female Preventive care refers to lifestyle choices and visits with your health care provider that can promote health and wellness. What does preventive care include? A yearly physical exam. This is also called an annual well check. Dental exams once or twice a year. Routine eye exams. Ask your health care provider how often you should have your eyes checked. Personal lifestyle choices, including: Daily care of your teeth and gums. Regular physical activity. Eating a healthy diet. Avoiding tobacco and drug use. Limiting alcohol use. Practicing safe sex. Taking low-dose aspirin every day. Taking vitamin  and mineral supplements as recommended by your health care provider. What happens during an annual well check? The services and screenings done by your health care provider during your annual well check will depend on your age, overall health, lifestyle risk factors, and family history of disease. Counseling  Your health care provider may ask you questions about your: Alcohol use. Tobacco use. Drug use. Emotional well-being. Home and relationship well-being. Sexual activity. Eating habits. History of falls. Memory and ability to understand (cognition). Work and work Statistician. Reproductive health. Screening  You may have the following tests or measurements: Height, weight, and BMI. Blood pressure. Lipid and cholesterol levels. These may be checked every 5 years, or more frequently if you are over 48 years old. Skin check. Lung cancer screening. You may have this screening every year starting at age 52 if you have a 30-pack-year history of smoking and currently smoke or have quit within the past 15 years. Fecal occult blood test (FOBT) of the stool. You may have this test every year starting at age 31. Flexible sigmoidoscopy or colonoscopy. You may have a sigmoidoscopy every 5 years or a colonoscopy every 10 years starting at age 14. Hepatitis C blood test. Hepatitis B blood test. Sexually transmitted disease (STD) testing. Diabetes screening. This is done by checking your blood sugar (glucose) after you have not eaten for a while (fasting). You may have this done every 1-3 years. Bone density scan. This is done to screen for osteoporosis. You may have this done starting at age 28. Mammogram. This may be done every 1-2 years. Talk to your health care provider about how often you should have regular mammograms. Talk with your health care provider about your test results, treatment options, and if necessary,  the need for more tests. Vaccines  Your health care provider may recommend  certain vaccines, such as: Influenza vaccine. This is recommended every year. Tetanus, diphtheria, and acellular pertussis (Tdap, Td) vaccine. You may need a Td booster every 10 years. Zoster vaccine. You may need this after age 33. Pneumococcal 13-valent conjugate (PCV13) vaccine. One dose is recommended after age 7. Pneumococcal polysaccharide (PPSV23) vaccine. One dose is recommended after age 72. Talk to your health care provider about which screenings and vaccines you need and how often you need them. This information is not intended to replace advice given to you by your health care provider. Make sure you discuss any questions you have with your health care provider. Document Released: 11/19/2015 Document Revised: 07/12/2016 Document Reviewed: 08/24/2015 Elsevier Interactive Patient Education  2017 [place] Prevention in the Home Falls can cause injuries. They can happen to people of all ages. There are many things you can do to make your home safe and to help prevent falls. What can I do on the outside of my home? Regularly fix the edges of walkways and driveways and fix any cracks. Remove anything that might make you trip as you walk through a door, such as a raised step or threshold. Trim any bushes or trees on the path to your home. Use bright outdoor lighting. Clear any walking paths of anything that might make someone trip, such as rocks or tools. Regularly check to see if handrails are loose or broken. Make sure that both sides of any steps have handrails. Any raised decks and porches should have guardrails on the edges. Have any leaves, snow, or ice cleared regularly. Use sand or salt on walking paths during winter. Clean up any spills in your garage right away. This includes oil or grease spills. What can I do in the bathroom? Use night lights. Install grab bars by the toilet and in the tub and shower. Do not use towel bars as grab bars. Use non-skid mats or  decals in the tub or shower. If you need to sit down in the shower, use a plastic, non-slip stool. Keep the floor dry. Clean up any water that spills on the floor as soon as it happens. Remove soap buildup in the tub or shower regularly. Attach bath mats securely with double-sided non-slip rug tape. Do not have throw rugs and other things on the floor that can make you trip. What can I do in the bedroom? Use night lights. Make sure that you have a light by your bed that is easy to reach. Do not use any sheets or blankets that are too big for your bed. They should not hang down onto the floor. Have a firm chair that has side arms. You can use this for support while you get dressed. Do not have throw rugs and other things on the floor that can make you trip. What can I do in the kitchen? Clean up any spills right away. Avoid walking on wet floors. Keep items that you use a lot in easy-to-reach places. If you need to reach something above you, use a strong step stool that has a grab bar. Keep electrical cords out of the way. Do not use floor polish or wax that makes floors slippery. If you must use wax, use non-skid floor wax. Do not have throw rugs and other things on the floor that can make you trip. What can I do with my stairs? Do not leave any items on  the stairs. Make sure that there are handrails on both sides of the stairs and use them. Fix handrails that are broken or loose. Make sure that handrails are as long as the stairways. Check any carpeting to make sure that it is firmly attached to the stairs. Fix any carpet that is loose or worn. Avoid having throw rugs at the top or bottom of the stairs. If you do have throw rugs, attach them to the floor with carpet tape. Make sure that you have a light switch at the top of the stairs and the bottom of the stairs. If you do not have them, ask someone to add them for you. What else can I do to help prevent falls? Wear shoes that: Do not  have high heels. Have rubber bottoms. Are comfortable and fit you well. Are closed at the toe. Do not wear sandals. If you use a stepladder: Make sure that it is fully opened. Do not climb a closed stepladder. Make sure that both sides of the stepladder are locked into place. Ask someone to hold it for you, if possible. Clearly mark and make sure that you can see: Any grab bars or handrails. First and last steps. Where the edge of each step is. Use tools that help you move around (mobility aids) if they are needed. These include: Canes. Walkers. Scooters. Crutches. Turn on the lights when you go into a dark area. Replace any light bulbs as soon as they burn out. Set up your furniture so you have a clear path. Avoid moving your furniture around. If any of your floors are uneven, fix them. If there are any pets around you, be aware of where they are. Review your medicines with your doctor. Some medicines can make you feel dizzy. This can increase your chance of falling. Ask your doctor what other things that you can do to help prevent falls. This information is not intended to replace advice given to you by your health care provider. Make sure you discuss any questions you have with your health care provider. Document Released: 08/19/2009 Document Revised: 03/30/2016 Document Reviewed: 11/27/2014 Elsevier Interactive Patient Education  2017 [employer].

## 2021-12-05 NOTE — Progress Notes (Signed)
Subjective:   Anita Kennedy is a 71 y.o. female who presents for Medicare Annual (Subsequent) preventive examination. Virtual Visit via Telephone Note  I connected with  Anita Kennedy on 12/05/21 at  8:15 AM EST by telephone and verified that I am speaking with the correct person using two identifiers.  Location: Patient: Home Provider: [facility] Persons participating in the virtual visit: patient/Nurse Health Advisor   I discussed the limitations, risks, security and privacy concerns of performing an evaluation and management service by telephone and the availability of in person appointments. The patient expressed understanding and agreed to proceed.  Interactive audio and video telecommunications were attempted between this nurse and patient, however failed, due to patient having technical difficulties OR patient did not have access to video capability.  We continued and completed visit with audio only.  Some vital signs may be absent or patient reported.   Chriss Driver, LPN  Review of Systems     Cardiac Risk Factors include: advanced age (>71men, >31 women);hypertension;dyslipidemia;sedentary lifestyle;obesity (BMI >30kg/m2)  PHONE VISIT. PT AT HOME NURSE AT [facility]    Objective:    Today's Vitals   12/05/21 0818  Weight: 184 lb (83.5 kg)  Height: 5\' 5"  (1.651 m)   Body mass index is 30.62 kg/m.  Advanced Directives 12/05/2021 03/24/2019 08/13/2017 11/03/2016  Does Patient Have a Medical Advance Directive? No No No No  Would patient like information on creating a medical advance directive? No - Patient declined No - Patient declined Yes (ED - Information included in AVS) No - Patient declined    Current Medications (verified) Outpatient Encounter Medications as of 12/05/2021  Medication Sig   acetaminophen (TYLENOL) 650 MG CR tablet Take 1,300 mg by mouth every 8 (eight) hours as needed for pain.   cholecalciferol (VITAMIN D) 400 units TABS tablet Take 400 Units by  mouth.   ibuprofen (ADVIL,MOTRIN) 200 MG tablet Take 200 mg by mouth every 6 (six) hours as needed.   Krill Oil 1000 MG CAPS Take by mouth.   meloxicam (MOBIC) 7.5 MG tablet Take 1 tablet (7.5 mg total) by mouth 2 (two) times daily.   rosuvastatin (CRESTOR) 5 MG tablet Take 1 tablet (5 mg total) by mouth daily.   valACYclovir (VALTREX) 1000 MG tablet Take 1 tablet (1,000 mg total) by mouth 2 (two) times daily.   No facility-administered encounter medications on file as of 12/05/2021.    Allergies (verified) Penicillins and Nickel   History: Past Medical History:  Diagnosis Date   Arthritis    Hyperlipidemia    Kidney stones    Plantar fasciitis of right foot    Past Surgical History:  Procedure Laterality Date   BREAST CYST ASPIRATION Left    1990s   CESAREAN SECTION     CHOLECYSTECTOMY     COLONOSCOPY N/A 11/03/2016   Procedure: COLONOSCOPY;  Surgeon: Danie Binder, MD;  Location: [facility];  Service: Endoscopy;  Laterality: N/A;  1;00 pm   MOLE REMOVAL     TONSILLECTOMY     Family History  Problem Relation Age of Onset   Heart disease Mother    Alcohol abuse Father    Early death Father    Heart disease Father    Hyperlipidemia Father    Hypertension Father    Cancer Maternal Grandmother    Depression Maternal Grandmother    Breast cancer Maternal Grandmother        unsure of age    Colon  cancer Neg Hx    Social History   Socioeconomic History   Marital status: Divorced    Spouse name: Not on file   Number of children: 9   Years of education: some college   Highest education level: High school graduate  Occupational History   Occupation: Retired  Tobacco Use   Smoking status: Former    Packs/day: 0.25    Types: Cigarettes    Quit date: 10/05/2013    Years since quitting: 8.1   Smokeless tobacco: Never  Vaping Use   Vaping Use: Never used  Substance and Sexual Activity   Alcohol use: No   Drug use: No   Sexual activity: Not Currently    Birth  control/protection: Post-menopausal  Other Topics Concern   Not on file  Social History Narrative   6 children living.   9 grandchildren and 2 great grandchildren.   Social Determinants of Health   Financial Resource Strain: Low Risk    Difficulty of Paying Living Expenses: Not hard at all  Food Insecurity: No Food Insecurity   Worried About Charity fundraiser in the Last Year: Never true   [place] in the Last Year: Never true  Transportation Needs: No Transportation Needs   Lack of Transportation (Medical): No   Lack of Transportation (Non-Medical): No  Physical Activity: Sufficiently Active   Days of Exercise per Week: 5 days   Minutes of Exercise per Session: 40 min  Stress: No Stress Concern Present   Feeling of Stress : Not at all  Social Connections: Socially Isolated   Frequency of Communication with Friends and Family: More than three times a week   Frequency of Social Gatherings with Friends and Family: More than three times a week   Attends Religious Services: Never   Marine scientist or Organizations: No   Attends Music therapist: Never   Marital Status: Divorced    Tobacco Counseling Counseling given: Not Answered   Clinical Intake:  Pre-visit preparation completed: Yes  Pain : No/denies pain     BMI - recorded: 30.62 Nutritional Status: BMI > 30  Obese Nutritional Risks: None Diabetes: No  How often do you need to have someone help you when you read instructions, pamphlets, or other written materials from your doctor or pharmacy?: 1 - Never  Diabetic?NO  Interpreter Needed?: No  Information entered by :: mj Torrian Canion, lpn   Activities of Daily Living In your present state of health, do you have any difficulty performing the following activities: 12/05/2021  Hearing? N  Vision? N  Difficulty concentrating or making decisions? N  Walking or climbing stairs? N  Dressing or bathing? N  Doing errands, shopping? N   Preparing Food and eating ? N  Using the Toilet? N  In the past six months, have you accidently leaked urine? N  Do you have problems with loss of bowel control? N  Managing your Medications? N  Managing your Finances? N  Housekeeping or managing your Housekeeping? N  Some recent data might be hidden    Patient Care Team: Janora Norlander, DO as PCP - General (Family Medicine)  Indicate any recent Medical Services you may have received from other than [facility] providers in the past year (date may be approximate).     Assessment:   This is a routine wellness examination for [facility].  Hearing/Vision screen Hearing Screening - Comments:: No hearing issues.  Vision Screening - Comments:: Glasses. [facility]  20[facility].  Dietary issues and exercise activities discussed: Current Exercise Habits: Home exercise routine, Type of exercise: walking, Time (Minutes): 40, Frequency (Times/Week): 5, Weekly Exercise (Minutes/Week): 200, Intensity: Mild, Exercise limited by: orthopedic condition(s);cardiac condition(s)   Goals Addressed             This Visit's Progress    DIET - REDUCE CALORIE INTAKE       Would like to lose weight.       Depression Screen PHQ 2/9 Scores 12/05/2021 10/19/2021 10/03/2021 06/16/2021 12/21/2020 05/04/2020 10/24/2019  PHQ - 2 Score 0 0 0 0 0 0 0  PHQ- 9 Score - 0 0 1 - 0 0    Fall Risk Fall Risk  12/05/2021 10/19/2021 10/03/2021 06/16/2021 12/21/2020  Falls in the past year? 0 0 0 1 1  Number falls in past yr: 0 - - 0 0  Injury with Fall? 0 - - 0 0  Risk for fall due to : No Fall Risks - - History of fall(s) -  Follow up Falls prevention discussed - - Falls evaluation completed -    FALL RISK PREVENTION PERTAINING TO THE HOME:  Any stairs in or around the home? Yes  If so, are there any without handrails? No  Home free of loose throw rugs in walkways, pet beds, electrical cords, etc? Yes  Adequate lighting in your home to reduce risk of falls? Yes    ASSISTIVE DEVICES UTILIZED TO PREVENT FALLS:  Life alert? No  Use of a cane, walker or w/c? Yes  Grab bars in the bathroom? Yes  Shower chair or bench in shower? Yes  Elevated toilet seat or a handicapped toilet? Yes   TIMED UP AND GO:  Was the test performed? No .  Phone visit.   Cognitive Function: MMSE - Mini Mental State Exam 08/13/2017  Orientation to time 5  Orientation to Place 5  Registration 3  Attention/ Calculation 5  Recall 2  Language- name 2 objects 2  Language- repeat 1  Language- follow 3 step command 3  Language- read & follow direction 1  Write a sentence 1  Copy design 1  Total score 29     6CIT Screen 12/05/2021 12/05/2021 03/24/2019  What Year? 0 points 0 points 0 points  What month? 0 points 0 points 0 points  What time? 0 points 0 points 0 points  Count back from 20 0 points 0 points 0 points  Months in reverse 0 points 0 points 0 points  Repeat phrase 0 points 0 points 0 points  Total Score 0 0 0    Immunizations Immunization History  Administered Date(s) Administered   Fluad Quad(high Dose 65+) 07/09/2019   Hepatitis B, adult 06/25/2009, 11/11/2009   Influenza, High Dose Seasonal PF 08/01/2017, 08/05/2018   Influenza-Unspecified 07/27/2020, 07/05/2021   Moderna Sars-Covid-2 Vaccination 02/04/2020, 03/03/2020   PFIZER(Purple Top)SARS-COV-2 Vaccination 09/13/2020   Pfizer Covid-19 Vaccine Bivalent Booster 65yrs & up 10/15/2021   Pneumococcal Conjugate-13 10/05/2016   Pneumococcal Polysaccharide-23 10/08/2017   Tdap 01/31/2010, 02/02/2020   Zoster, Live 10/05/2016    TDAP status: Up to date  Flu Vaccine status: Up to date  Pneumococcal vaccine status: Up to date  Covid-19 vaccine status: Completed vaccines  Qualifies for Shingles Vaccine? Yes   Zostavax completed Yes   Shingrix Completed?: No.    Education has been provided regarding the importance of this vaccine. Patient has been advised to call insurance company to determine  out of pocket expense if they  have not yet received this vaccine. Advised may also receive vaccine at local pharmacy or Health Dept. Verbalized acceptance and understanding.  Screening Tests Health Maintenance  Topic Date Due   Zoster Vaccines- Shingrix (1 of 2) 01/17/2022 (Originally 08/07/2000)   MAMMOGRAM  12/21/2022   COLONOSCOPY (Pts 45-67yrs Insurance coverage will need to be confirmed)  11/03/2026   TETANUS/TDAP  02/01/2030   Pneumonia Vaccine 84+ Years old  Completed   INFLUENZA VACCINE  Completed   DEXA SCAN  Completed   COVID-19 Vaccine  Completed   Hepatitis C Screening  Completed   HPV VACCINES  Aged Out    Health Maintenance  There are no preventive care reminders to display for this patient.   Colorectal cancer screening: Type of screening: Colonoscopy. Completed 10/24/2016. Repeat every 10 years  Mammogram status: Completed 12/21/2020. Repeat every year  Bone Density status: Completed 08/13/2017. Results reflect: Bone density results: NORMAL. Repeat every 2 years.  Lung Cancer Screening: (Low Dose CT Chest recommended if Age 14-80 years, 30 pack-year currently smoking OR have quit w/in 15years.) does not qualify.    Additional Screening:  Hepatitis C Screening: does qualify; Completed 10/05/2016  Vision Screening: Recommended annual ophthalmology exams for early detection of glaucoma and other disorders of the eye. Is the patient up to date with their annual eye exam?  No  Who is the provider or what is the name of the office in which the patient attends annual eye exams? [facility] If pt is not established with a provider, would they like to be referred to a provider to establish care? No .   Dental Screening: Recommended annual dental exams for proper oral hygiene  Community Resource Referral / Chronic Care Management: CRR required this visit?  No   CCM required this visit?  No      Plan:     I have personally reviewed and noted the following  in the patients chart:   Medical and social history Use of alcohol, tobacco or illicit drugs  Current medications and supplements including opioid prescriptions.  Functional ability and status Nutritional status Physical activity Advanced directives List of other physicians Hospitalizations, surgeries, and ER visits in previous 12 months Vitals Screenings to include cognitive, depression, and falls Referrals and appointments  In addition, I have reviewed and discussed with patient certain preventive protocols, quality metrics, and best practice recommendations. A written personalized care plan for preventive services as well as general preventive health recommendations were provided to patient.     Charee Tumblin, LPN   7/32/2025   Nurse Notes: Pt is up to date on health maintenance and vaccines except for bone density and shingrix. Discussed both with pt an how to obtain.

## 2022-01-18 ENCOUNTER — Telehealth: Payer: Self-pay

## 2022-01-18 NOTE — Chronic Care Management (AMB) (Signed)
?  Chronic Care Management  ? ?Note ? ?01/18/2022 ?Name: Anita Kennedy MRN: [id number] DOB: Jun 27, 1951 ? ?Anita Kennedy is a 71 y.o. year old female who is a primary care patient of Janora Norlander, DO. I reached out to Freddrick March by phone today in response to a referral sent by [name] PCP. ? ?[name] was given information about Chronic Care Management services today including:  ?CCM service includes personalized support from designated clinical staff supervised by her physician, including individualized plan of care and coordination with other care providers ?24/7 contact phone numbers for assistance for urgent and routine care needs. ?Service will only be billed when office clinical staff spend 20 minutes or more in a month to coordinate care. ?Only one practitioner may furnish and bill the service in a calendar month. ?The patient may stop CCM services at any time (effective at the end of the month) by phone call to the office staff. ?The patient is responsible for co-pay (up to 20% after annual deductible is met) if co-pay is required by the individual health plan.  ? ?Patient did not agree to enrollment in care management services and does not wish to consider at this time. ? ?Follow up plan: ?Patient declines further follow up and engagement by the care management team. Appropriate care team members and provider have been notified via electronic communication.  ? ?Noreene Larsson, RMA ?Care Guide, Embedded Care Coordination ?[facility]  ?[place], [place] [address] ?Direct Dial: [phone] ?[email or web address] ?Website: [email or web address]  ? ?

## 2022-01-18 NOTE — Chronic Care Management (AMB) (Signed)
?  Chronic Care Management  ? ?Outreach Note ? ?01/18/2022 ?Name: [name] MRN: [id number] DOB: 1950-11-29 ? ?Anita Kennedy is a 71 y.o. year old female who is a primary care patient of Janora Norlander, DO. I reached out to Freddrick March by phone today in response to a referral sent by Ms. Lenon Ahmadi Linderman's primary care provider. ? ?An unsuccessful telephone outreach was attempted today. The patient was referred to the case management team for assistance with care management and care coordination.  ? ?Follow Up Plan: A HIPAA compliant phone message was left for the patient providing contact information and requesting a return call.  ?The care management team will reach out to the patient again over the next 7 days.  ?If patient returns call to provider office, please advise to call [place]an * at [phone]* ? ?Noreene Larsson, RMA ?Care Guide, Embedded Care Coordination ?[facility]  ?[place], [place] [address] ?Direct Dial: [phone] ?[email or web address] ?Website: [email or web address]  ? ?

## 2022-01-20 ENCOUNTER — Other Ambulatory Visit: Payer: Self-pay | Admitting: Family Medicine

## 2022-01-20 DIAGNOSIS — Z1231 Encounter for screening mammogram for malignant neoplasm of breast: Secondary | ICD-10-CM

## 2022-02-06 ENCOUNTER — Encounter: Payer: Self-pay | Admitting: Nurse Practitioner

## 2022-02-06 ENCOUNTER — Ambulatory Visit (INDEPENDENT_AMBULATORY_CARE_PROVIDER_SITE_OTHER): Payer: Medicare Other | Admitting: Nurse Practitioner

## 2022-02-06 VITALS — BP 170/93 | HR 61 | Temp 97.9°F | Resp 20 | Ht 65.0 in | Wt 190.0 lb

## 2022-02-06 DIAGNOSIS — I10 Essential (primary) hypertension: Secondary | ICD-10-CM

## 2022-02-06 DIAGNOSIS — R079 Chest pain, unspecified: Secondary | ICD-10-CM

## 2022-02-06 DIAGNOSIS — M1712 Unilateral primary osteoarthritis, left knee: Secondary | ICD-10-CM

## 2022-02-06 LAB — CBC WITH DIFFERENTIAL/PLATELET
Basophils Absolute: 0.1 10*3/uL (ref 0.0–0.2)
Basos: 1 %
EOS (ABSOLUTE): 0.2 10*3/uL (ref 0.0–0.4)
Eos: 3 %
Hematocrit: 40.3 % (ref 34.0–46.6)
Hemoglobin: 13.7 g/dL (ref 11.1–15.9)
Immature Grans (Abs): 0 10*3/uL (ref 0.0–0.1)
Immature Granulocytes: 0 %
Lymphocytes Absolute: 2.3 10*3/uL (ref 0.7–3.1)
Lymphs: 32 %
MCH: 29.4 pg (ref 26.6–33.0)
MCHC: 34 g/dL (ref 31.5–35.7)
MCV: 87 fL (ref 79–97)
Monocytes Absolute: 0.4 10*3/uL (ref 0.1–0.9)
Monocytes: 6 %
Neutrophils Absolute: 4.2 10*3/uL (ref 1.4–7.0)
Neutrophils: 58 %
Platelets: 298 10*3/uL (ref 150–450)
RBC: 4.66 x10E6/uL (ref 3.77–5.28)
RDW: 12.3 % (ref 11.7–15.4)
WBC: 7.2 10*3/uL (ref 3.4–10.8)

## 2022-02-06 LAB — CMP14+EGFR
ALT: 16 IU/L (ref 0–32)
AST: 17 IU/L (ref 0–40)
Albumin/Globulin Ratio: 2.4 — ABNORMAL HIGH (ref 1.2–2.2)
Albumin: 4.5 g/dL (ref 3.7–4.7)
Alkaline Phosphatase: 92 IU/L (ref 44–121)
BUN/Creatinine Ratio: 23 (ref 12–28)
BUN: 17 mg/dL (ref 8–27)
Bilirubin Total: 0.2 mg/dL (ref 0.0–1.2)
CO2: 22 mmol/L (ref 20–29)
Calcium: 9.4 mg/dL (ref 8.7–10.3)
Chloride: 106 mmol/L (ref 96–106)
Creatinine, Ser: 0.75 mg/dL (ref 0.57–1.00)
Globulin, Total: 1.9 g/dL (ref 1.5–4.5)
Glucose: 100 mg/dL — ABNORMAL HIGH (ref 70–99)
Potassium: 4.5 mmol/L (ref 3.5–5.2)
Sodium: 140 mmol/L (ref 134–144)
Total Protein: 6.4 g/dL (ref 6.0–8.5)
eGFR: 85 mL/min/{1.73_m2} (ref >59)

## 2022-02-06 MED ORDER — MELOXICAM 7.5 MG PO TABS: 7.5000 mg | ORAL_TABLET | Freq: Two times a day (BID) | ORAL | 1 refills | Status: DC

## 2022-02-06 MED ORDER — LISINOPRIL 20 MG PO TABS: 20.0000 mg | ORAL_TABLET | Freq: Every day | ORAL | 3 refills | Status: DC

## 2022-02-06 NOTE — Progress Notes (Signed)
? ?Subjective:  ? ? Patient ID: Anita Kennedy, female    DOB: April 06, 1951, 71 y.o.   MRN: [id number] ? ? ?Chief Complaint: Chest Pain ?Patient in c/o chest pain. The last episode was 3 weeks ago and pain radiated to right shoulder jaw and ear.  ? ?Chest Pain  ?This is a new problem. The current episode started more than 1 month ago. The onset quality is sudden. The problem occurs rarely (hasoccurred 3 x in th elast sevral months.). The problem has been gradually worsening (last about 3-5 minutes). The pain is present in the substernal region. The pain is at a severity of 7/10. The pain is moderate. The quality of the pain is described as heavy. The pain radiates to the right arm, right shoulder and right jaw. Associated symptoms include shortness of breath. Pertinent negatives include no abdominal pain, diaphoresis, dizziness, headaches, palpitations or weakness.  ?Her past medical history is significant for hyperlipidemia.  ?Pertinent negatives for past medical history include no CAD, no congenital heart disease, no COPD and no CHF.  ? ? ? ? ?Review of Systems  ?Constitutional:  Negative for diaphoresis.  ?Eyes:  Negative for pain.  ?Respiratory:  Positive for chest tightness and shortness of breath.   ?Cardiovascular:  Positive for chest pain. Negative for palpitations and leg swelling.  ?Gastrointestinal:  Negative for abdominal pain.  ?Endocrine: Negative for polydipsia.  ?Skin:  Negative for rash.  ?Neurological:  Negative for dizziness, weakness and headaches.  ?Hematological:  Does not bruise/bleed easily.  ?All other systems reviewed and are negative. ? ?   ?Objective:  ? Physical Exam ?Vitals and nursing note reviewed.  ?Constitutional:   ?   General: She is not in acute distress. ?   Appearance: Normal appearance. She is well-developed.  ?HENT:  ?   Head: Normocephalic.  ?   Right Ear: Tympanic membrane normal.  ?   Left Ear: Tympanic membrane normal.  ?   Nose: Nose normal.  ?   Mouth/Throat:  ?   Mouth:  Mucous membranes are moist.  ?Eyes:  ?   Pupils: Pupils are equal, round, and reactive to light.  ?Neck:  ?   Vascular: No carotid bruit or JVD.  ?Cardiovascular:  ?   Rate and Rhythm: Normal rate and regular rhythm.  ?   Heart sounds: Normal heart sounds.  ?Pulmonary:  ?   Effort: Pulmonary effort is normal. No respiratory distress.  ?   Breath sounds: Normal breath sounds. No wheezing or rales.  ?Chest:  ?   Chest wall: No tenderness.  ?Abdominal:  ?   General: Bowel sounds are normal. There is no distension or abdominal bruit.  ?   Palpations: Abdomen is soft. There is no hepatomegaly, splenomegaly, mass or pulsatile mass.  ?   Tenderness: There is no abdominal tenderness.  ?Musculoskeletal:     ?   General: Normal range of motion.  ?   Cervical back: Normal range of motion and neck supple.  ?Lymphadenopathy:  ?   Cervical: No cervical adenopathy.  ?Skin: ?   General: Skin is warm and dry.  ?Neurological:  ?   Mental Status: She is alert and oriented to person, place, and time.  ?   Deep Tendon Reflexes: Reflexes are normal and symmetric.  ?Psychiatric:     ?   Behavior: Behavior normal.     ?   Thought Content: Thought content normal.     ?   Judgment: Judgment normal.  ? ? ?  BP (!) 170/93   Pulse 61   Temp 97.9 ?F (36.6 ?C)   Resp 20   Ht '5\' 5"'  (1.651 m)   Wt 190 lb (86.2 kg)   SpO2 97%   BMI 31.62 kg/m?  ? ?EKG- [facility]-Margaret Hassell Done, FNP ? ? ?   ?Assessment & Plan:  ? ?Anita Kennedy comes in today with chief complaint of Chest Pain ? ? ?Diagnosis and orders addressed: ? ?1. Chest pain, unspecified type ?Referral to crdiology ?If pain reoccurs go too the ED ?- EKG 12-Lead ?- Ambulatory referral to Cardiology ? ?2. Primary hypertension ?Low sodium diet ?- lisinopril (ZESTRIL) 20 MG tablet; Take 1 tablet (20 mg total) by mouth daily.  Dispense: 90 tablet; Refill: 3 ?- CBC with Differential/Platelet ?- CMP14+EGFR ? ?3. Primary osteoarthritis of left knee ?Needs to follow up with ortho ?- meloxicam  (MOBIC) 7.5 MG tablet; Take 1 tablet (7.5 mg total) by mouth 2 (two) times daily.  Dispense: 60 tablet; Refill: 1 ? ? ?Labs pending ?Health Maintenance reviewed ?Diet and exercise encouraged ? ?Follow up plan: ?prn ? ? ?[facility]-Margaret Hassell Done, FNP ? ? ?

## 2022-02-06 NOTE — Patient Instructions (Signed)
Chest Wall Pain ?Chest wall pain is pain in or around the bones and muscles of your chest. Sometimes, an injury causes this pain. Excessive coughing or overuse of arm and chest muscles may also cause chest wall pain. Sometimes, the cause may not be known. This pain may take several weeks or longer to get better. ?Follow these instructions at home: ?Managing pain, stiffness, and swelling ? ?If directed, put ice on the painful area: ?Put ice in a plastic bag. ?Place a towel between your skin and the bag. ?Leave the ice on for 20 minutes, 2-3 times per day. ?Activity ?Rest as told by your health care provider. ?Avoid activities that cause pain. These include any activities that use your chest muscles or your abdominal and side muscles to lift heavy items. Ask your health care provider what activities are safe for you. ?General instructions ? ?Take over-the-counter and prescription medicines only as told by your health care provider. ?Do not use any products that contain nicotine or tobacco, such as cigarettes, e-cigarettes, and chewing tobacco. These can delay healing after injury. If you need help quitting, ask your health care provider. ?Keep all follow-up visits as told by your health care provider. This is important. ?Contact a health care provider if: ?You have a fever. ?Your chest pain becomes worse. ?You have new symptoms. ?Get help right away if: ?You have nausea or vomiting. ?You feel sweaty or light-headed. ?You have a cough with mucus from your lungs (sputum) or you cough up blood. ?You develop shortness of breath. ?These symptoms may represent a serious problem that is an emergency. Do not wait to see if the symptoms will go away. Get medical help right away. Call your local emergency services (911 in the [place] Do not drive yourself to the hospital. ?Summary ?Chest wall pain is pain in or around the bones and muscles of your chest. ?Depending on the cause, it may be treated with ice, rest, medicines, and  avoiding activities that cause pain. ?Contact a health care provider if you have a fever, worsening chest pain, or new symptoms. ?Get help right away if you feel light-headed or you develop shortness of breath. These symptoms may be an emergency. ?This information is not intended to replace advice given to you by your health care provider. Make sure you discuss any questions you have with your health care provider. ?Document Revised: 01/07/2021 Document Reviewed: 01/07/2021 ?Elsevier Patient Education ? [place]. ? ?

## 2022-02-07 ENCOUNTER — Ambulatory Visit
Admission: RE | Admit: 2022-02-07 | Discharge: 2022-02-07 | Disposition: A | Discharge status: Home / Self Care | Payer: Medicare Other | Source: Ambulatory Visit | Attending: Family Medicine | Admitting: Family Medicine

## 2022-02-07 DIAGNOSIS — Z1231 Encounter for screening mammogram for malignant neoplasm of breast: Secondary | ICD-10-CM

## 2022-02-08 ENCOUNTER — Encounter: Payer: Self-pay | Admitting: *Deleted

## 2022-03-28 DIAGNOSIS — R072 Precordial pain: Secondary | ICD-10-CM | POA: Insufficient documentation

## 2022-03-28 NOTE — Progress Notes (Unsigned)
Cardiology Office Note   Date:  03/29/2022   ID:  Anita Kennedy, DOB 1951-01-03, MRN [id number]  PCP:  Janora Norlander, DO  Cardiologist:   None Referring:  Janora Norlander, DO  Chief Complaint  Patient presents with   Chest Pain      History of Present Illness: Anita Kennedy is a 71 y.o. female who presents for evaluation of chest pain.  She has had 3 episodes of this.  She is never had any cardiac work-up but she does have risk factors.  She says that in November and then a few months later and then another time she had chest discomfort.  This happens at rest.  She feels up to a 4 out of 10 discomfort under her chest with jaw and ear pain.  She has taken aspirin on a couple occasions.  It might last for about 20 minutes.  She cannot bring this on with activity.  She never had this before.  It is not positional.  She does not have any PND or orthopnea.  She does not really feel palpitations, presyncope or syncope.  She has not had any prior cardiac testing.        Past Medical History:  Diagnosis Date   Arthritis    Hyperlipidemia    Hypertension    Kidney stones    Plantar fasciitis of right foot     Past Surgical History:  Procedure Laterality Date   BREAST CYST ASPIRATION Left    1990s   CESAREAN SECTION     CHOLECYSTECTOMY     COLONOSCOPY N/A 11/03/2016   Procedure: COLONOSCOPY;  Surgeon: Danie Binder, MD;  Location: [facility];  Service: Endoscopy;  Laterality: N/A;  1;00 pm   MOLE REMOVAL     TONSILLECTOMY       Current Outpatient Medications  Medication Sig Dispense Refill   acetaminophen (TYLENOL) 650 MG CR tablet Take 1,300 mg by mouth every 8 (eight) hours as needed for pain.     cholecalciferol (VITAMIN D) 400 units TABS tablet Take 400 Units by mouth.     lisinopril (ZESTRIL) 20 MG tablet Take 1 tablet (20 mg total) by mouth daily. 90 tablet 3   meloxicam (MOBIC) 7.5 MG tablet Take 1 tablet (7.5 mg total) by mouth 2 (two) times daily. 60  tablet 1   metoprolol tartrate (LOPRESSOR) 100 MG tablet Take 1 tablet (100 mg total) by mouth as directed. Take 1 tablet 2 hours before your CT scan 1 tablet 0   rosuvastatin (CRESTOR) 5 MG tablet Take 1 tablet (5 mg total) by mouth daily. 90 tablet 3   valACYclovir (VALTREX) 1000 MG tablet Take 1 tablet (1,000 mg total) by mouth 2 (two) times daily. 20 tablet 0   No current facility-administered medications for this visit.    Allergies:   Penicillins and Nickel    Social History:  The patient  reports that she quit smoking about 8 years ago. Her smoking use included cigarettes. She smoked an average of .25 packs per day. She has never used smokeless tobacco. She reports that she does not drink alcohol and does not use drugs.   Family History:  The patient's family history includes Alcohol abuse in her father; Breast cancer in her maternal grandmother; Cancer in her maternal grandmother; Depression in her maternal grandmother; Early death in her father; Heart disease in her mother; Heart disease (age of onset: 64) in her father; Hyperlipidemia in her father;  Hypertension in her father.    ROS:  Please see the history of present illness.   Otherwise, review of systems are positive for none.   All other systems are reviewed and negative.    PHYSICAL EXAM: VS:  BP 136/78   Pulse 75   Ht '5\' 5"'$  (1.651 m)   Wt 185 lb 12.8 oz (84.3 kg)   SpO2 98%   BMI 30.92 kg/m  , BMI Body mass index is 30.92 kg/m. GENERAL:  Well appearing HEENT:  Pupils equal round and reactive, fundi not visualized, oral mucosa unremarkable NECK:  No jugular venous distention, waveform within normal limits, carotid upstroke brisk and symmetric, no bruits, no thyromegaly LYMPHATICS:  No cervical, inguinal adenopathy LUNGS:  Clear to auscultation bilaterally BACK:  No CVA tenderness CHEST:  Unremarkable HEART:  PMI not displaced or sustained,S1 and S2 within normal limits, no S3, no S4, no clicks, no rubs, no  murmurs ABD:  Flat, positive bowel sounds normal in frequency in pitch, no bruits, no rebound, no guarding, no midline pulsatile mass, no hepatomegaly, no splenomegaly EXT:  2 plus pulses throughout, no edema, no cyanosis no clubbing SKIN:  No rashes no nodules NEURO:  Cranial nerves II through XII grossly intact, motor grossly intact throughout PSYCH:  Cognitively intact, oriented to person place and time    EKG:  EKG is not ordered today. 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   Recent Labs: 02/06/2022: ALT 16; BUN 17; Creatinine, Ser 0.75; Hemoglobin 13.7; Platelets 298; Potassium 4.5; Sodium 140    Lipid Panel    Component Value Date/Time   CHOL 196 12/21/2020 0927   TRIG 90 12/21/2020 0927   HDL 66 12/21/2020 0927   CHOLHDL 3.0 12/21/2020 0927   LDLCALC 114 (H) 12/21/2020 0927      Wt Readings from Last 3 Encounters:  03/29/22 185 lb 12.8 oz (84.3 kg)  02/06/22 190 lb (86.2 kg)  12/05/21 184 lb (83.5 kg)      Other studies Reviewed: Additional studies/ records that were reviewed today include: Labs, EKG ordered previously. Review of the above records demonstrates:  Please see elsewhere in the note.     ASSESSMENT AND PLAN:  PRECORDIAL CHEST PAIN: Her chest discomfort has some typical features and she does have risk factors.  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  She has some plaquing noted in her aorta on previous CT.  The pretest probability of obstructive coronary disease is at least moderate.  I will plan coronary CTA.  AORTIC ATHEROSCLEROSIS: This will be managed as above.  She will have a risk reduction  DYSLIPIDEMIA: Her LDL was 114 last year.  She is due to have this repeated soon.  Goals of therapy will be based on presence or absence of plaque.  I would think at least an LDL less  than 100 given the aortic atherosclerosis however should be the goal.   Current medicines are reviewed at length with the patient today.  The patient does not have concerns regarding medicines.  The following changes have been made:  no change  Labs/ tests ordered today include:   Orders Placed This Encounter  Procedures   CT CORONARY MORPH W/CTA COR W/SCORE W/CA W/CM &/OR WO/CM   Basic metabolic panel     Disposition:   FU with me based on the results of  the above   Signed, Minus Breeding, MD  03/29/2022 3:02 PM    [facility]

## 2022-03-29 ENCOUNTER — Ambulatory Visit (INDEPENDENT_AMBULATORY_CARE_PROVIDER_SITE_OTHER): Payer: Medicare Other | Admitting: Cardiology

## 2022-03-29 ENCOUNTER — Encounter: Payer: Self-pay | Admitting: Cardiology

## 2022-03-29 VITALS — BP 136/78 | HR 75 | Ht 65.0 in | Wt 185.8 lb

## 2022-03-29 DIAGNOSIS — R072 Precordial pain: Secondary | ICD-10-CM | POA: Diagnosis not present

## 2022-03-29 DIAGNOSIS — Z01812 Encounter for preprocedural laboratory examination: Secondary | ICD-10-CM | POA: Diagnosis not present

## 2022-03-29 MED ORDER — METOPROLOL TARTRATE 100 MG PO TABS: 100.0000 mg | ORAL_TABLET | ORAL | 0 refills | Status: DC

## 2022-03-29 NOTE — Patient Instructions (Signed)
Medication Instructions:  The current medical regimen is effective;  continue present plan and medications.  *If you need a refill on your cardiac medications before your next appointment, please call your pharmacy*   Lab Work: Will need to have blood work before your CT scan.  This can be completed at [facility] at least 2 days before the test.  (BMP) If you have labs (blood work) drawn today and your tests are completely normal, you will receive your results only by: [facility] (if you have MyChart) OR A paper copy in the mail If you have any lab test that is abnormal or we need to change your treatment, we will call you to review the results.   Testing/Procedures:   Your cardiac CT will be scheduled at:   [facility] [address] [place], [place] [address] [phone]  Please arrive at the [facility] and Children's Entrance (Entrance C2) of [facility] 30 minutes prior to test start time. You can use the FREE valet parking offered at entrance C (encouraged to control the heart rate for the test)  Proceed to the [facility] Radiology Department (first floor) to check-in and test prep.  All radiology patients and guests should use entrance C2 at [facility], accessed from [facility], even though the hospital's physical address listed is [address].    Please follow these instructions carefully (unless otherwise directed):  On the Night Before the Test: Be sure to Drink plenty of water. Do not consume any caffeinated/decaffeinated beverages or chocolate 12 hours prior to your test. Do not take any antihistamines 12 hours prior to your test.  On the Day of the Test: Drink plenty of water until 1 hour prior to the test. Do not eat any food 4 hours prior to the test. You may take your regular medications prior to the test.  Take metoprolol (Lopressor) two hours prior to test. HOLD Furosemide/Hydrochlorothiazide morning of  the test. FEMALES- please wear underwire-free bra if available, avoid dresses & tight clothing  After the Test: Drink plenty of water. After receiving IV contrast, you may experience a mild flushed feeling. This is normal. On occasion, you may experience a mild rash up to 24 hours after the test. This is not dangerous. If this occurs, you can take Benadryl 25 mg and increase your fluid intake. If you experience trouble breathing, this can be serious. If it is severe call 911 IMMEDIATELY. If it is mild, please call our office. If you take any of these medications: Glipizide/Metformin, Avandament, Glucavance, please do not take 48 hours after completing test unless otherwise instructed.  We will call to schedule your test 2-4 weeks out understanding that some insurance companies will need an authorization prior to the service being performed.   For non-scheduling related questions, please contact the cardiac imaging nurse navigator should you have any questions/concerns: Marchia Bond, Cardiac Imaging Nurse Navigator Gordy Clement, Cardiac Imaging Nurse Navigator [facility] Direct Office Dial: [phone]   For scheduling needs, including cancellations and rescheduling, please call [place], [phone].    Follow-Up: At [facility], you and your health needs are our priority.  As part of our continuing mission to provide you with exceptional heart care, we have created designated Provider Care Teams.  These Care Teams include your primary Cardiologist (physician) and Advanced Practice Providers (APPs -  Physician Assistants and Nurse Practitioners) who all work together to provide you with the care you  need, when you need it.  We recommend signing up for the patient portal called "MyChart".  Sign up information is provided on this After Visit Summary.  MyChart is used to connect with patients for Virtual Visits (Telemedicine).  Patients are able to view  lab/test results, encounter notes, upcoming appointments, etc.  Non-urgent messages can be sent to your provider as well.   To learn more about what you can do with MyChart, go to [email or web address].    Your next appointment:   Follow up will be based on the results of your CT scan.  Important Information About Sugar

## 2022-03-30 ENCOUNTER — Ambulatory Visit (INDEPENDENT_AMBULATORY_CARE_PROVIDER_SITE_OTHER): Payer: Medicare Other | Admitting: Family Medicine

## 2022-03-30 ENCOUNTER — Encounter: Payer: Self-pay | Admitting: Family Medicine

## 2022-03-30 VITALS — BP 135/68 | HR 61 | Temp 97.4°F | Ht 65.0 in | Wt 185.0 lb

## 2022-03-30 DIAGNOSIS — E7800 Pure hypercholesterolemia, unspecified: Secondary | ICD-10-CM | POA: Diagnosis not present

## 2022-03-30 DIAGNOSIS — M609 Myositis, unspecified: Secondary | ICD-10-CM | POA: Diagnosis not present

## 2022-03-30 DIAGNOSIS — I728 Aneurysm of other specified arteries: Secondary | ICD-10-CM | POA: Diagnosis not present

## 2022-03-30 DIAGNOSIS — I10 Essential (primary) hypertension: Secondary | ICD-10-CM

## 2022-03-30 DIAGNOSIS — Z0000 Encounter for general adult medical examination without abnormal findings: Secondary | ICD-10-CM

## 2022-03-30 DIAGNOSIS — R072 Precordial pain: Secondary | ICD-10-CM

## 2022-03-30 DIAGNOSIS — Z01812 Encounter for preprocedural laboratory examination: Secondary | ICD-10-CM | POA: Diagnosis not present

## 2022-03-30 DIAGNOSIS — T466X5A Adverse effect of antihyperlipidemic and antiarteriosclerotic drugs, initial encounter: Secondary | ICD-10-CM | POA: Diagnosis not present

## 2022-03-30 DIAGNOSIS — E669 Obesity, unspecified: Secondary | ICD-10-CM

## 2022-03-30 DIAGNOSIS — R7303 Prediabetes: Secondary | ICD-10-CM | POA: Diagnosis not present

## 2022-03-30 LAB — BASIC METABOLIC PANEL
BUN/Creatinine Ratio: 25 (ref 12–28)
BUN: 20 mg/dL (ref 8–27)
CO2: 21 mmol/L (ref 20–29)
Calcium: 9.6 mg/dL (ref 8.7–10.3)
Chloride: 104 mmol/L (ref 96–106)
Creatinine, Ser: 0.81 mg/dL (ref 0.57–1.00)
Glucose: 98 mg/dL (ref 70–99)
Potassium: 4.7 mmol/L (ref 3.5–5.2)
Sodium: 140 mmol/L (ref 134–144)
eGFR: 78 mL/min/{1.73_m2} (ref >59)

## 2022-03-30 LAB — BAYER DCA HB A1C WAIVED: HB A1C (BAYER DCA - WAIVED): 5.4 % (ref 4.8–5.6)

## 2022-03-30 MED ORDER — ROSUVASTATIN CALCIUM 5 MG PO TABS: 5.0000 mg | ORAL_TABLET | Freq: Every day | ORAL | 3 refills | Status: DC

## 2022-03-30 NOTE — Patient Instructions (Signed)

## 2022-03-30 NOTE — Progress Notes (Signed)
Anita Kennedy is a 71 y.o. female presents to office today for annual physical exam examination.    Concerns today include: 1.  Splenic artery aneurysm Patient is due for follow-up on splenic artery aneurysm.  Last CT was performed in January 2021 and at that time the aneurysm was 1.6 cm at its greatest diameter.  She has been evaluated by vascular in the past and the recommendation is as if it becomes greater than 2 cm she is to proceed with elective surgery.  She is wondering if we might be able to coordinate this scan with the cardiac scan that she has coming up.  She has been under the care of cardiology for evaluation of chest pain that is been ongoing but very intermittent.  She is intermittently compliant with the lisinopril that was started by Ronnald Collum.  She has Lopressor prescribed for use prior to that CAT scan.  Uncertain compliance with the rosuvastatin.  Diet: Actively try to work on diet and exercise Last eye exam: Up-to-date Last colonoscopy: Last in 2017 was given 5 to 10-year follow-up by Dr. Oneida Alar Last mammogram: Up-to-date.  Performed April 2023 Last pap smear: Aged out Refills needed today: None Immunizations needed: Immunization History  Administered Date(s) Administered   Fluad Quad(high Dose 65+) 07/09/2019   Hepatitis B, adult 06/25/2009, 11/11/2009   Influenza, High Dose Seasonal PF 08/01/2017, 08/05/2018   Influenza-Unspecified 07/27/2020, 07/05/2021   Moderna Sars-Covid-2 Vaccination 02/04/2020, 03/03/2020   PFIZER(Purple Top)SARS-COV-2 Vaccination 09/13/2020   Pfizer Covid-19 Vaccine Bivalent Booster 67yr & up 10/15/2021   Pneumococcal Conjugate-13 10/05/2016   Pneumococcal Polysaccharide-23 10/08/2017   Tdap 01/31/2010, 02/02/2020   Zoster, Live 10/05/2016     Past Medical History:  Diagnosis Date   Arthritis    Hyperlipidemia    Hypertension    Kidney stones    Plantar fasciitis of right foot    Social History   Socioeconomic History    Marital status: Divorced    Spouse name: Not on file   Number of children: 9   Years of education: some college   Highest education level: High school graduate  Occupational History   Occupation: Retired  Tobacco Use   Smoking status: Former    Packs/day: 0.25    Types: Cigarettes    Quit date: 10/05/2013    Years since quitting: 8.4   Smokeless tobacco: Never  Vaping Use   Vaping Use: Never used  Substance and Sexual Activity   Alcohol use: No   Drug use: No   Sexual activity: Not Currently    Birth control/protection: Post-menopausal  Other Topics Concern   Not on file  Social History Narrative   6 children living.   9 grandchildren and 2 great grandchildren.   Social Determinants of Health   Financial Resource Strain: Low Risk    Difficulty of Paying Living Expenses: Not hard at all  Food Insecurity: No Food Insecurity   Worried About RCharity fundraiserin the Last Year: Never true   R[place] the Last Year: Never true  Transportation Needs: No Transportation Needs   Lack of Transportation (Medical): No   Lack of Transportation (Non-Medical): No  Physical Activity: Sufficiently Active   Days of Exercise per Week: 5 days   Minutes of Exercise per Session: 40 min  Stress: No Stress Concern Present   Feeling of Stress : Not at all  Social Connections: Socially Isolated   Frequency of Communication with Friends and Family: More  than three times a week   Frequency of Social Gatherings with Friends and Family: More than three times a week   Attends Religious Services: Never   Marine scientist or Organizations: No   Attends Archivist Meetings: Never   Marital Status: Divorced  Human resources officer Violence: Not At Risk   Fear of Current or Ex-Partner: No   Emotionally Abused: No   Physically Abused: No   Sexually Abused: No   Past Surgical History:  Procedure Laterality Date   BREAST CYST ASPIRATION Left    1990s   CESAREAN SECTION      CHOLECYSTECTOMY     COLONOSCOPY N/A 11/03/2016   Procedure: COLONOSCOPY;  Surgeon: Danie Binder, MD;  [facility]: [facility];  Service: Endoscopy;  Laterality: N/A;  1;00 pm   MOLE REMOVAL     TONSILLECTOMY     Family History  Problem Relation Age of Onset   Heart disease Mother        Irregular heart beat   Alcohol abuse Father    Early death Father        Died of MI age 3   Heart disease Father 34   Hyperlipidemia Father    Hypertension Father    Cancer Maternal Grandmother    Depression Maternal Grandmother    Breast cancer Maternal Grandmother        unsure of age    Colon cancer Neg Hx     Current Outpatient Medications:    acetaminophen (TYLENOL) 650 MG CR tablet, Take 1,300 mg by mouth every 8 (eight) hours as needed for pain., Disp: , Rfl:    lisinopril (ZESTRIL) 20 MG tablet, Take 1 tablet (20 mg total) by mouth daily., Disp: 90 tablet, Rfl: 3   meloxicam (MOBIC) 7.5 MG tablet, Take 1 tablet (7.5 mg total) by mouth 2 (two) times daily., Disp: 60 tablet, Rfl: 1   Multiple Vitamin (MULTIVITAMIN) tablet, Take 1 tablet by mouth daily., Disp: , Rfl:    rosuvastatin (CRESTOR) 5 MG tablet, Take 1 tablet (5 mg total) by mouth daily., Disp: 90 tablet, Rfl: 3   valACYclovir (VALTREX) 1000 MG tablet, Take 1 tablet (1,000 mg total) by mouth 2 (two) times daily., Disp: 20 tablet, Rfl: 0   metoprolol tartrate (LOPRESSOR) 100 MG tablet, Take 1 tablet (100 mg total) by mouth as directed. Take 1 tablet 2 hours before your CT scan (Patient not taking: Reported on 03/30/2022), Disp: 1 tablet, Rfl: 0  Allergies  Allergen Reactions   Penicillins Hives   Nickel Rash     ROS: Review of Systems A comprehensive review of systems was negative except for: Eyes: positive for contacts/glasses Cardiovascular: positive for no recurrence of her chest pain but she has been having some atypical chest pain and is currently being worked up by cardiology for this Genitourinary: positive for urge  incontinence    Physical exam BP 135/68   Pulse 61   Temp (!) 97.4 F (36.3 C)   Ht '5\' 5"'$  (1.651 m)   Wt 185 lb (83.9 kg)   SpO2 96%   BMI 30.79 kg/m  General appearance: alert, cooperative, appears stated age, no distress, and mildly obese Head: Normocephalic, without obvious abnormality, atraumatic Eyes: negative findings: lids and lashes normal, conjunctivae and sclerae normal, corneas clear, and pupils equal, round, reactive to light and accomodation Ears: normal TM's and external ear canals both ears Nose: Nares normal. Septum midline. Mucosa normal. No drainage or sinus tenderness. Throat:  Oropharynx without erythema or masses.  No sublingual masses Neck: no adenopathy, no carotid bruit, supple, symmetrical, trachea midline, and thyroid not enlarged, symmetric, no tenderness/mass/nodules Back: symmetric, no curvature. ROM normal. No CVA tenderness. Lungs: clear to auscultation bilaterally Heart: regular rate and rhythm, S1, S2 normal, no murmur, click, rub or gallop Abdomen: soft, non-tender; bowel sounds normal; no masses,  no organomegaly Extremities: extremities normal, atraumatic, no cyanosis or edema Pulses: 2+ and symmetric Skin: Skin color, texture, turgor normal. No rashes or lesions Lymph nodes: Cervical, supraclavicular, and axillary nodes normal. Neurologic: Grossly normal Psych: Somewhat anxious discussing her health but otherwise mood stable     02/06/2022   10:14 AM 12/05/2021    8:23 AM 10/19/2021    8:12 AM  Depression screen PHQ 2/9  Decreased Interest 0 0 0  Down, Depressed, Hopeless 0 0 0  PHQ - 2 Score 0 0 0  Altered sleeping   0  Tired, decreased energy   0  Change in appetite   0  Feeling bad or failure about yourself    0  Trouble concentrating   0  Moving slowly or fidgety/restless   0  Suicidal thoughts   0  PHQ-9 Score   0  Difficult doing work/chores   Not difficult at all      02/06/2022   10:14 AM 10/19/2021    8:12 AM 10/03/2021    11:09 AM 06/16/2021    9:47 AM  GAD 7 : Generalized Anxiety Score  Nervous, Anxious, on Edge 0 0 0 0  Control/stop worrying 0 0 0 0  Worry too much - different things 0 0 0 0  Trouble relaxing 0 0 0 0  Restless 0 0 0 0  Easily annoyed or irritable 0 0 0 0  Afraid - awful might happen 0 0 0 0  Total GAD 7 Score 0 0 0 0  Anxiety Difficulty Not difficult at all Not difficult at all Not difficult at all Not difficult at all     Assessment/ Plan: Freddrick March here for annual physical exam.   Primary hypertension  Annual physical exam  Pure hypercholesterolemia - Plan: Lipid Panel, TSH  Statin-induced myositis  Pre-diabetes - Plan: Bayer DCA Hb A1c Waived  Obesity (BMI 30.0-34.9)  Splenic artery aneurysm (HCC) - Plan: CT ANGIO ABDOMEN W &/OR WO CONTRAST  Precordial chest pain - Plan: Basic metabolic panel  Needs shingles vaccination  Blood pressures well controlled.  No changes.  Check fasting lipid, TSH.  BMP ordered by cardiology as well today.  Has a CT coronary artery scan with calcium score ordered by cardiology as well.  Check A1c given history of prediabetes and ongoing obesity  Due for follow-up on splenic artery aneurysm.  Previously evaluated by Dr. Donnetta Hutching and 1 to 2-year follow-up with CT angio abdomen was recommended.  Again, if lesion becomes greater than 2 cm she should proceed with elective surgery.  We will see if we can coordinate this along with a CT of the heart that her cardiologist has ordered.   Afnan Emberton M. Lajuana Ripple, DO

## 2022-03-31 LAB — LIPID PANEL
Chol/HDL Ratio: 3.2 ratio (ref 0.0–4.4)
Cholesterol, Total: 193 mg/dL (ref 100–199)
HDL: 61 mg/dL (ref >39)
LDL Chol Calc (NIH): 118 mg/dL — ABNORMAL HIGH (ref 0–99)
Triglycerides: 76 mg/dL (ref 0–149)
VLDL Cholesterol Cal: 14 mg/dL (ref 5–40)

## 2022-03-31 LAB — TSH: TSH: 1.07 u[IU]/mL (ref 0.450–4.500)

## 2022-04-04 ENCOUNTER — Other Ambulatory Visit: Payer: Self-pay | Admitting: Family Medicine

## 2022-04-04 ENCOUNTER — Encounter: Payer: Self-pay | Admitting: *Deleted

## 2022-04-04 MED ORDER — ROSUVASTATIN CALCIUM 10 MG PO TABS: 10.0000 mg | ORAL_TABLET | Freq: Every day | ORAL | 3 refills | Status: DC

## 2022-04-11 ENCOUNTER — Ambulatory Visit: Payer: Medicare Other

## 2022-04-18 ENCOUNTER — Telehealth: Payer: Self-pay | Admitting: *Deleted

## 2022-04-18 NOTE — Telephone Encounter (Signed)
Referring MD/PCP: Dr. Paulo Fruit  Procedure: Colonoscopy  Has patient had this procedure before?  Dr. Oneida Alar, 11/03/16  If so, when, by whom and where?    Is there a family history of colon cancer?  no  Who?  What age when diagnosed?    Is patient diabetic? If yes, Type 1 or Type 2   no      Does patient have prosthetic heart valve or mechanical valve?  no  Do you have a pacemaker/defibrillator?  no  Has patient ever had endocarditis/atrial fibrillation? no  Does patient use oxygen? no  Has patient had joint replacement within last 12 months?  no  Is patient constipated or do they take laxatives? no  Does patient have a history of alcohol/drug use?  no  Have you had a stroke/heart attack last 6 mths? no  Do you take medicine for weight loss?  no  For female patients,: have you had a hysterectomy no                      are you post menopausal no                      do you still have your menstrual cycle no  Is patient on blood thinner such as Coumadin, Plavix and/or Aspirin? no  Medications:  Current Outpatient Medications on File Prior to Visit  Medication Sig Dispense Refill   acetaminophen (TYLENOL) 650 MG CR tablet Take 1,300 mg by mouth every 8 (eight) hours as needed for pain.     lisinopril (ZESTRIL) 20 MG tablet Take 1 tablet (20 mg total) by mouth daily. 90 tablet 3   meloxicam (MOBIC) 7.5 MG tablet Take 1 tablet (7.5 mg total) by mouth 2 (two) times daily. 60 tablet 1   metoprolol tartrate (LOPRESSOR) 100 MG tablet Take 1 tablet (100 mg total) by mouth as directed. Take 1 tablet 2 hours before your CT scan (Patient not taking: Reported on 03/30/2022) 1 tablet 0   Multiple Vitamin (MULTIVITAMIN) tablet Take 1 tablet by mouth daily.     rosuvastatin (CRESTOR) 10 MG tablet Take 1 tablet (10 mg total) by mouth daily. Dose change 90 tablet 3   valACYclovir (VALTREX) 1000 MG tablet Take 1 tablet (1,000 mg total) by mouth 2 (two) times daily. 20 tablet 0   No current  facility-administered medications on file prior to visit.     Allergies:  Allergies  Allergen Reactions   Penicillins Hives   Nickel Rash

## 2022-04-21 ENCOUNTER — Telehealth (HOSPITAL_COMMUNITY): Payer: Self-pay | Admitting: *Deleted

## 2022-04-21 NOTE — Telephone Encounter (Signed)
Reaching out to patient to offer assistance regarding upcoming cardiac imaging study; pt verbalizes understanding of appt date/time, parking situation and where to check in, pre-test NPO status and medications ordered, and verified current allergies; name and call back number provided for further questions should they arise  Gordy Clement RN Navigator Cardiac Imaging [facility] [phone] office [phone] cell  Patient to take '50mg'$  metoprolol tartrate two hours prior to her cardiac CT scan if her HR greater than 65bpm. She is aware to arrive 8:30am.

## 2022-04-24 ENCOUNTER — Other Ambulatory Visit: Payer: Self-pay | Admitting: Internal Medicine

## 2022-04-24 ENCOUNTER — Ambulatory Visit (HOSPITAL_COMMUNITY)
Admission: RE | Admit: 2022-04-24 | Discharge: 2022-04-24 | Disposition: A | Discharge status: Home / Self Care | Payer: Medicare Other | Source: Ambulatory Visit | Attending: Internal Medicine | Admitting: Internal Medicine

## 2022-04-24 ENCOUNTER — Ambulatory Visit (HOSPITAL_COMMUNITY)
Admission: RE | Admit: 2022-04-24 | Discharge: 2022-04-24 | Disposition: A | Discharge status: Home / Self Care | Payer: Medicare Other | Source: Ambulatory Visit | Attending: Cardiology | Admitting: Cardiology

## 2022-04-24 DIAGNOSIS — I2510 Atherosclerotic heart disease of native coronary artery without angina pectoris: Secondary | ICD-10-CM

## 2022-04-24 DIAGNOSIS — I728 Aneurysm of other specified arteries: Secondary | ICD-10-CM | POA: Diagnosis not present

## 2022-04-24 DIAGNOSIS — R072 Precordial pain: Secondary | ICD-10-CM | POA: Insufficient documentation

## 2022-04-24 DIAGNOSIS — R931 Abnormal findings on diagnostic imaging of heart and coronary circulation: Secondary | ICD-10-CM

## 2022-04-24 DIAGNOSIS — Z9049 Acquired absence of other specified parts of digestive tract: Secondary | ICD-10-CM | POA: Diagnosis not present

## 2022-04-24 DIAGNOSIS — I700 Atherosclerosis of aorta: Secondary | ICD-10-CM | POA: Diagnosis not present

## 2022-04-24 MED ORDER — IOHEXOL 350 MG/ML SOLN
100.0000 mL | Freq: Once | INTRAVENOUS | Status: AC | PRN
  Administered 2022-04-24: 100 mL via INTRAVENOUS

## 2022-04-24 MED ORDER — NITROGLYCERIN 0.4 MG SL SUBL
SUBLINGUAL_TABLET | SUBLINGUAL | Status: AC
  Filled 2022-04-24: qty 2

## 2022-04-24 MED ORDER — NITROGLYCERIN 0.4 MG SL SUBL
0.8000 mg | SUBLINGUAL_TABLET | Freq: Once | SUBLINGUAL | Status: AC
  Administered 2022-04-24: 0.8 mg via SUBLINGUAL

## 2022-04-24 NOTE — Progress Notes (Signed)
FFR for coronary CT was negative  -Mali

## 2022-04-24 NOTE — Progress Notes (Unsigned)
Please send for FFR - DR. Bryten Maher

## 2022-05-10 NOTE — Telephone Encounter (Signed)
Colonoscopy 2017 with two 5-7 mm polyps at hepatic flexure, redundant left colon, internal hemorrhoids. Tubular adenomas.   She has had some chest pain and recently saw Cardiology. Coronary CT was negative. However, she will be following up with Dr. Debara Kennedy in August.  I recommend office visit (Can be virtual) for triage. Could put with Anita Kennedy, as she is trying to do virtuals on Fridays.

## 2022-05-10 NOTE — Telephone Encounter (Signed)
Please schedule. thanks

## 2022-05-19 ENCOUNTER — Telehealth: Payer: Medicare Other | Admitting: Gastroenterology

## 2022-06-12 DIAGNOSIS — I2510 Atherosclerotic heart disease of native coronary artery without angina pectoris: Secondary | ICD-10-CM | POA: Insufficient documentation

## 2022-06-12 DIAGNOSIS — I700 Atherosclerosis of aorta: Secondary | ICD-10-CM | POA: Insufficient documentation

## 2022-06-12 NOTE — Progress Notes (Unsigned)
Cardiology Office Note   Date:  06/14/2022   ID:  Anita Kennedy, DOB Nov 10, 1950, MRN [id number]  PCP:  Janora Norlander, DO  Cardiologist:   None Referring:  Janora Norlander, DO  Chief Complaint  Patient presents with   Coronary Artery Disease      History of Present Illness: Anita Kennedy is a 70 y.o. female who presents for evaluation of chest pain.  She had CTA with results below.  This does not appear to be flow limiting.    I brought her back to talk about this.  She is doing quite well.  The patient denies any new symptoms such as chest discomfort, neck or arm discomfort. There has been no new shortness of breath, PND or orthopnea. There have been no reported palpitations, presyncope or syncope.  She pushes a lawnmower.  She has had none of the discomfort that was described previously.   Past Medical History:  Diagnosis Date   Arthritis    Hyperlipidemia    Hypertension    Kidney stones    Plantar fasciitis of right foot     Past Surgical History:  Procedure Laterality Date   BREAST CYST ASPIRATION Left    1990s   CESAREAN SECTION     CHOLECYSTECTOMY     COLONOSCOPY N/A 11/03/2016   Procedure: COLONOSCOPY;  Surgeon: Danie Binder, MD;  Location: [facility];  Service: Endoscopy;  Laterality: N/A;  1;00 pm   MOLE REMOVAL     TONSILLECTOMY       Current Outpatient Medications  Medication Sig Dispense Refill   acetaminophen (TYLENOL) 650 MG CR tablet Take 1,300 mg by mouth every 8 (eight) hours as needed for pain.     aspirin EC 81 MG tablet Take 81 mg by mouth daily. Swallow whole.     lisinopril (ZESTRIL) 20 MG tablet Take 1 tablet (20 mg total) by mouth daily. 90 tablet 3   Multiple Vitamin (MULTIVITAMIN) tablet Take 1 tablet by mouth daily.     rosuvastatin (CRESTOR) 10 MG tablet Take 1 tablet (10 mg total) by mouth daily. Dose change 90 tablet 3   valACYclovir (VALTREX) 1000 MG tablet Take 1 tablet (1,000 mg total) by mouth 2 (two) times daily.  20 tablet 0   meloxicam (MOBIC) 7.5 MG tablet Take 1 tablet (7.5 mg total) by mouth 2 (two) times daily. (Patient not taking: Reported on 04/24/2022) 60 tablet 1   No current facility-administered medications for this visit.    Allergies:   Penicillins and Nickel    ROS:  Please see the history of present illness.   Otherwise, review of systems are positive for none.   All other systems are reviewed and negative.    PHYSICAL EXAM: VS:  BP (!) 148/88   Pulse 88   Ht '5\' 5"'$  (1.651 m)   Wt 183 lb (83 kg)   BMI 30.45 kg/m  , BMI Body mass index is 30.45 kg/m. GENERAL:  Well appearing NECK:  No jugular venous distention, waveform within normal limits, carotid upstroke brisk and symmetric, no bruits, no thyromegaly LUNGS:  Clear to auscultation bilaterally CHEST:  Unremarkable HEART:  PMI not displaced or sustained,S1 and S2 within normal limits, no S3, no S4, no clicks, no rubs, no murmurs ABD:  Flat, positive bowel sounds normal in frequency in pitch, no bruits, no rebound, no guarding, no midline pulsatile mass, no hepatomegaly, no splenomegaly EXT:  2 plus pulses throughout, no edema, no  cyanosis no clubbing   CORONARY CTA 04/24/2022:   Coronary arteries: Normal coronary origins.  Right dominance.   Right Coronary Artery: Dominant. Minimal mixed proximal 1-24% stenosis (CADRADS1). Normal R-PDA and R-PLB branches.   Left Main Coronary Artery: Normal. Bifurcates into the LAD and LCX arteries.   Left Anterior Descending Coronary Artery: Smaller caliber vessel that does not reach the apex. Moderate to severe non-calcified plaque in the proximal vessel (CADRADS3). There is mild mixed stenosis distal to this (25-49%, CADRADS2). Large diagonal branch without disease.   Left Circumflex Artery: Tortuous AV groove vessel with mild mixed stenosis (25-49%, CADRADS2) of the proximal to mid vessel.   Aorta: Normal size, 34 mm at the mid ascending aorta (level of the PA bifurcation)  measured double oblique. No calcifications. No dissection.   EKG:  EKG is not ordered today.    Recent Labs: 02/06/2022: ALT 16; Hemoglobin 13.7; Platelets 298 03/30/2022: BUN 20; Creatinine, Ser 0.81; Potassium 4.7; Sodium 140; TSH 1.070    Lipid Panel    Component Value Date/Time   CHOL 193 03/30/2022 1003   TRIG 76 03/30/2022 1003   HDL 61 03/30/2022 1003   CHOLHDL 3.2 03/30/2022 1003   LDLCALC 118 (H) 03/30/2022 1003      Wt Readings from Last 3 Encounters:  06/14/22 183 lb (83 kg)  03/30/22 185 lb (83.9 kg)  03/29/22 185 lb 12.8 oz (84.3 kg)      Other studies Reviewed: Additional studies/ records that were reviewed today include: Labs Review of the above records demonstrates:  Please see elsewhere in the note.     ASSESSMENT AND PLAN:  CAD:  The patient has no new sypmtoms.  No further cardiovascular testing is indicated.  We will continue with aggressive risk reduction and meds as listed.  Given the nonobstructive disease we will proceed with aggressive risk reduction  AORTIC ATHEROSCLEROSIS: This will be managed with risk reduction as below.  DYSLIPIDEMIA: Her LDL was mildly elevated at 118 and she did have her Crestor doubled.  I put her down to get a cholesterol profile in 3 months with a goal LDL less than 70.    Current medicines are reviewed at length with the patient today.  The patient does not have concerns regarding medicines.  The following changes have been made:  no change  Labs/ tests ordered today include:   Orders Placed This Encounter  Procedures   Lipid panel     Disposition:   FU with me in one year.    Signed, Minus Breeding, MD  06/14/2022 3:57 PM    [place]

## 2022-06-14 ENCOUNTER — Encounter: Payer: Self-pay | Admitting: Cardiology

## 2022-06-14 ENCOUNTER — Ambulatory Visit (INDEPENDENT_AMBULATORY_CARE_PROVIDER_SITE_OTHER): Payer: Medicare Other | Admitting: Cardiology

## 2022-06-14 ENCOUNTER — Other Ambulatory Visit: Payer: Self-pay | Admitting: *Deleted

## 2022-06-14 VITALS — BP 148/88 | HR 88 | Ht 65.0 in | Wt 183.0 lb

## 2022-06-14 DIAGNOSIS — I700 Atherosclerosis of aorta: Secondary | ICD-10-CM

## 2022-06-14 DIAGNOSIS — E785 Hyperlipidemia, unspecified: Secondary | ICD-10-CM

## 2022-06-14 DIAGNOSIS — I2510 Atherosclerotic heart disease of native coronary artery without angina pectoris: Secondary | ICD-10-CM

## 2022-06-14 NOTE — Patient Instructions (Signed)
Medication Instructions:  The current medical regimen is effective;  continue present plan and medications.  *If you need a refill on your cardiac medications before your next appointment, please call your pharmacy*   Lab Work: Please have fasting Lipid panel at [facility] in November 2023.  If you have labs (blood work) drawn today and your tests are completely normal, you will receive your results only by: [place] (if you have MyChart) OR A paper copy in the mail If you have any lab test that is abnormal or we need to change your treatment, we will call you to review the results.  Follow-Up: At [facility], you and your health needs are our priority.  As part of our continuing mission to provide you with exceptional heart care, we have created designated Provider Care Teams.  These Care Teams include your primary Cardiologist (physician) and Advanced Practice Providers (APPs -  Physician Assistants and Nurse Practitioners) who all work together to provide you with the care you need, when you need it.  We recommend signing up for the patient portal called "MyChart".  Sign up information is provided on this After Visit Summary.  MyChart is used to connect with patients for Virtual Visits (Telemedicine).  Patients are able to view lab/test results, encounter notes, upcoming appointments, etc.  Non-urgent messages can be sent to your provider as well.   To learn more about what you can do with MyChart, go to [email or web address].    Your next appointment:   1 year(s)  The format for your next appointment:   In Person  Provider:   Minus Breeding, MD{  Important Information About Sugar

## 2022-07-12 ENCOUNTER — Encounter: Payer: Self-pay | Admitting: Family Medicine

## 2022-07-12 ENCOUNTER — Ambulatory Visit (INDEPENDENT_AMBULATORY_CARE_PROVIDER_SITE_OTHER): Payer: Medicare Other | Admitting: Family Medicine

## 2022-07-12 DIAGNOSIS — J029 Acute pharyngitis, unspecified: Secondary | ICD-10-CM | POA: Diagnosis not present

## 2022-07-12 LAB — RAPID STREP SCREEN (MED CTR MEBANE ONLY): Strep Gp A Ag, IA W/Reflex: NEGATIVE

## 2022-07-12 LAB — CULTURE, GROUP A STREP

## 2022-07-12 NOTE — Progress Notes (Signed)
Virtual Visit via telephone Note  I connected with Anita Kennedy on 07/12/22 at 1048 by telephone and verified that I am speaking with the correct person using two identifiers. Anita Kennedy is currently located at home and patient are currently with her during visit. The provider, Fransisca Kaufmann Rosella Crandell, MD is located in their office at time of visit.  Call ended at 1053  I discussed the limitations, risks, security and privacy concerns of performing an evaluation and management service by telephone and the availability of in person appointments. I also discussed with the patient that there may be a patient responsible charge related to this service. The patient expressed understanding and agreed to proceed.   History and Present Illness: Patient has been around kids with strep and colds and she was visiting families.  She started with sore throat yesterday.  She denies fevers or chills.  She is nasally and congestion. She has a little better today after alka seltzer cold and flu.  She denies wheezing or shortness of breath.  She did a home covid test and it was negative.    Outpatient Encounter Medications as of 07/12/2022  Medication Sig   acetaminophen (TYLENOL) 650 MG CR tablet Take 1,300 mg by mouth every 8 (eight) hours as needed for pain.   aspirin EC 81 MG tablet Take 81 mg by mouth daily. Swallow whole.   lisinopril (ZESTRIL) 20 MG tablet Take 1 tablet (20 mg total) by mouth daily.   meloxicam (MOBIC) 7.5 MG tablet Take 1 tablet (7.5 mg total) by mouth 2 (two) times daily. (Patient not taking: Reported on 04/24/2022)   Multiple Vitamin (MULTIVITAMIN) tablet Take 1 tablet by mouth daily.   rosuvastatin (CRESTOR) 10 MG tablet Take 1 tablet (10 mg total) by mouth daily. Dose change   valACYclovir (VALTREX) 1000 MG tablet Take 1 tablet (1,000 mg total) by mouth 2 (two) times daily.   No facility-administered encounter medications on file as of 07/12/2022.    Review of Systems   Constitutional:  Negative for chills and fever.  HENT:  Positive for congestion, postnasal drip, rhinorrhea, sinus pressure, sinus pain and sore throat. Negative for ear discharge, ear pain and sneezing.   Eyes:  Negative for visual disturbance.  Respiratory:  Positive for cough. Negative for chest tightness and shortness of breath.   Cardiovascular:  Negative for chest pain and leg swelling.  Genitourinary:  Negative for difficulty urinating and dysuria.  Musculoskeletal:  Negative for back pain and gait problem.  Skin:  Negative for rash.  Neurological:  Negative for light-headedness and headaches.  Psychiatric/Behavioral:  Negative for agitation and behavioral problems.   All other systems reviewed and are negative.   Observations/Objective: Patient sounds comfortable   Strep negative.  Assessment and Plan: Problem List Items Addressed This Visit   None Visit Diagnoses     Sore throat    -  Primary   Relevant Orders   Rapid Strep Screen ([facility] ONLY)     Patient's strep was negative and her sore throat is feeling better today, will likely is that she has some kind of viral cold, states she tested negative for COVID at home and did not want to be tested at the office.  She says she is not feeling too bad today but just wanted to double check because of exposure to strep  Follow up plan: Return if symptoms worsen or fail to improve.     I discussed the assessment and treatment  plan with the patient. The patient was provided an opportunity to ask questions and all were answered. The patient agreed with the plan and demonstrated an understanding of the instructions.   The patient was advised to call back or seek an in-person evaluation if the symptoms worsen or if the condition fails to improve as anticipated.  The above assessment and management plan was discussed with the patient. The patient verbalized understanding of and has agreed to the management plan. Patient  is aware to call the clinic if symptoms persist or worsen. Patient is aware when to return to the clinic for a follow-up visit. Patient educated on when it is appropriate to go to the emergency department.    I provided 5 minutes of non-face-to-face time during this encounter.    Worthy Rancher, MD

## 2022-08-14 DIAGNOSIS — Z23 Encounter for immunization: Secondary | ICD-10-CM | POA: Diagnosis not present

## 2022-09-05 ENCOUNTER — Ambulatory Visit (INDEPENDENT_AMBULATORY_CARE_PROVIDER_SITE_OTHER): Payer: Medicare Other | Admitting: Family Medicine

## 2022-09-05 ENCOUNTER — Encounter: Payer: Self-pay | Admitting: Family Medicine

## 2022-09-05 VITALS — BP 140/71 | Temp 98.0°F | Ht 65.0 in | Wt 184.4 lb

## 2022-09-05 DIAGNOSIS — E669 Obesity, unspecified: Secondary | ICD-10-CM | POA: Diagnosis not present

## 2022-09-05 DIAGNOSIS — N3941 Urge incontinence: Secondary | ICD-10-CM | POA: Diagnosis not present

## 2022-09-05 DIAGNOSIS — R300 Dysuria: Secondary | ICD-10-CM | POA: Diagnosis not present

## 2022-09-05 DIAGNOSIS — E785 Hyperlipidemia, unspecified: Secondary | ICD-10-CM | POA: Diagnosis not present

## 2022-09-05 LAB — CMP14+EGFR
ALT: 14 IU/L (ref 0–32)
AST: 15 IU/L (ref 0–40)
Albumin/Globulin Ratio: 2.1 (ref 1.2–2.2)
Albumin: 4.5 g/dL (ref 3.8–4.8)
Alkaline Phosphatase: 89 IU/L (ref 44–121)
BUN/Creatinine Ratio: 24 (ref 12–28)
BUN: 21 mg/dL (ref 8–27)
Bilirubin Total: 0.3 mg/dL (ref 0.0–1.2)
CO2: 22 mmol/L (ref 20–29)
Calcium: 9.6 mg/dL (ref 8.7–10.3)
Chloride: 108 mmol/L — ABNORMAL HIGH (ref 96–106)
Creatinine, Ser: 0.86 mg/dL (ref 0.57–1.00)
Globulin, Total: 2.1 g/dL (ref 1.5–4.5)
Glucose: 115 mg/dL — ABNORMAL HIGH (ref 70–99)
Potassium: 4.6 mmol/L (ref 3.5–5.2)
Sodium: 143 mmol/L (ref 134–144)
Total Protein: 6.6 g/dL (ref 6.0–8.5)
eGFR: 72 mL/min/{1.73_m2} (ref >59)

## 2022-09-05 LAB — URINALYSIS, ROUTINE W REFLEX MICROSCOPIC
Bilirubin, UA: NEGATIVE
Glucose, UA: NEGATIVE
Ketones, UA: NEGATIVE
Nitrite, UA: NEGATIVE
Protein,UA: NEGATIVE
RBC, UA: NEGATIVE
Specific Gravity, UA: 1.02 (ref 1.005–1.030)
Urobilinogen, Ur: 0.2 mg/dL (ref 0.2–1.0)
pH, UA: 7.5 (ref 5.0–7.5)

## 2022-09-05 LAB — MICROSCOPIC EXAMINATION
RBC, Urine: NONE SEEN /hpf (ref 0–2)
Renal Epithel, UA: NONE SEEN /hpf

## 2022-09-05 MED ORDER — MIRABEGRON ER 50 MG PO TB24: 50.0000 mg | ORAL_TABLET | Freq: Every day | ORAL | 3 refills | Status: DC

## 2022-09-05 NOTE — Progress Notes (Signed)
Subjective:  Patient ID: Anita Kennedy, female    DOB: 1951/05/01, 71 y.o.   MRN: [id number]  Patient Care Team: Janora Norlander, DO as PCP - General (Family Medicine) Minus Breeding, MD as Consulting Physician (Cardiology)   Chief Complaint:  Dysuria (incontinence)   HPI: Anita Kennedy is a 71 y.o. female presenting on 09/05/2022 for Dysuria (incontinence)   Pt presents today for evaluation of urge incontinence of urine. States this has increased over the last several months. Reports she does try to do Kegel exercises but this is not beneficial. States when she has the urge to go she has dribbling with it, no other associated symptoms. Has not tried medications in the past for this.      Relevant past medical, surgical, family, and social history reviewed and updated as indicated.  Allergies and medications reviewed and updated. Data reviewed: Chart in Epic.   Past Medical History:  Diagnosis Date   Arthritis    Hyperlipidemia    Hypertension    Kidney stones    Plantar fasciitis of right foot     Past Surgical History:  Procedure Laterality Date   BREAST CYST ASPIRATION Left    1990s   CESAREAN SECTION     CHOLECYSTECTOMY     COLONOSCOPY N/A 11/03/2016   Procedure: COLONOSCOPY;  Surgeon: Danie Binder, MD;  Location: [facility];  Service: Endoscopy;  Laterality: N/A;  1;00 pm   MOLE REMOVAL     TONSILLECTOMY      Social History   Socioeconomic History   Marital status: Divorced    Spouse name: Not on file   Number of children: 9   Years of education: some college   Highest education level: High school graduate  Occupational History   Occupation: Retired  Tobacco Use   Smoking status: Former    Packs/day: 0.25    Types: Cigarettes    Quit date: 10/05/2013    Years since quitting: 8.9   Smokeless tobacco: Never  Vaping Use   Vaping Use: Never used  Substance and Sexual Activity   Alcohol use: No   Drug use: No   Sexual activity: Not  Currently    Birth control/protection: Post-menopausal  Other Topics Concern   Not on file  Social History Narrative   6 children living.   9 grandchildren and 2 great grandchildren.   Social Determinants of Health   Financial Resource Strain: Low Risk  (12/05/2021)   Overall Financial Resource Strain (CARDIA)    Difficulty of Paying Living Expenses: Not hard at all  Food Insecurity: No Food Insecurity (12/05/2021)   Hunger Vital Sign    Worried About Running Out of Food in the Last Year: Never true    Ran Out of Food in the Last Year: Never true  Transportation Needs: No Transportation Needs (12/05/2021)   PRAPARE - Hydrologist (Medical): No    Lack of Transportation (Non-Medical): No  Physical Activity: Sufficiently Active (12/05/2021)   Exercise Vital Sign    Days of Exercise per Week: 5 days    Minutes of Exercise per Session: 40 min  Stress: No Stress Concern Present (12/05/2021)   [place]    Feeling of Stress : Not at all  Social Connections: Socially Isolated (12/05/2021)   Social Connection and Isolation Panel [NHANES]    Frequency of Communication with Friends and Family: More than three times a  week    Frequency of Social Gatherings with Friends and Family: More than three times a week    Attends Religious Services: Never    Marine scientist or Organizations: No    Attends Archivist Meetings: Never    Marital Status: Divorced  Human resources officer Violence: Not At Risk (12/05/2021)   Humiliation, Afraid, Rape, and Kick questionnaire    Fear of Current or Ex-Partner: No    Emotionally Abused: No    Physically Abused: No    Sexually Abused: No    Outpatient Encounter Medications as of 09/05/2022  Medication Sig   acetaminophen (TYLENOL) 650 MG CR tablet Take 1,300 mg by mouth every 8 (eight) hours as needed for pain.   aspirin EC 81 MG tablet Take 81 mg by  mouth daily. Swallow whole.   lisinopril (ZESTRIL) 20 MG tablet Take 1 tablet (20 mg total) by mouth daily.   meloxicam (MOBIC) 7.5 MG tablet Take 1 tablet (7.5 mg total) by mouth 2 (two) times daily.   mirabegron ER (MYRBETRIQ) 50 MG TB24 tablet Take 1 tablet (50 mg total) by mouth daily.   Multiple Vitamin (MULTIVITAMIN) tablet Take 1 tablet by mouth daily.   rosuvastatin (CRESTOR) 10 MG tablet Take 1 tablet (10 mg total) by mouth daily. Dose change   valACYclovir (VALTREX) 1000 MG tablet Take 1 tablet (1,000 mg total) by mouth 2 (two) times daily.   No facility-administered encounter medications on file as of 09/05/2022.    Allergies  Allergen Reactions   Penicillins Hives   Nickel Rash    Review of Systems  Constitutional:  Negative for activity change, appetite change, chills, diaphoresis, fatigue, fever and unexpected weight change.  HENT: Negative.    Eyes: Negative.  Negative for photophobia and visual disturbance.  Respiratory:  Negative for cough, chest tightness and shortness of breath.   Cardiovascular:  Negative for chest pain, palpitations and leg swelling.  Gastrointestinal:  Negative for abdominal pain, blood in stool, constipation, diarrhea, nausea and vomiting.  Endocrine: Negative.  Negative for polydipsia, polyphagia and polyuria.  Genitourinary:  Negative for decreased urine volume, difficulty urinating, dyspareunia, dysuria, enuresis, flank pain, frequency, genital sores, hematuria, pelvic pain, urgency, vaginal bleeding, vaginal discharge and vaginal pain.       Urge incontinence  Musculoskeletal:  Negative for arthralgias and myalgias.  Skin: Negative.   Allergic/Immunologic: Negative.   Neurological:  Negative for dizziness, tremors, seizures, syncope, facial asymmetry, speech difficulty, weakness, light-headedness, numbness and headaches.  Hematological: Negative.   Psychiatric/Behavioral:  Negative for confusion, hallucinations, sleep disturbance and  suicidal ideas.   All other systems reviewed and are negative.       Objective:  BP (!) 140/71   Temp 98 F (36.7 C)   Ht _0  (1.651 m)   Wt 184 lb 6.4 oz (83.6 kg)   SpO2 97%   BMI 30.69 kg/m    Wt Readings from Last 3 Encounters:  09/05/22 184 lb 6.4 oz (83.6 kg)  06/14/22 183 lb (83 kg)  03/30/22 185 lb (83.9 kg)    Physical Exam Vitals and nursing note reviewed.  Constitutional:      General: She is not in acute distress.    Appearance: Normal appearance. She is obese. She is not ill-appearing, toxic-appearing or diaphoretic.  HENT:     Head: Normocephalic and atraumatic.     Mouth/Throat:     Mouth: Mucous membranes are moist.  Eyes:     Conjunctiva/sclera: Conjunctivae normal.  Pupils: Pupils are equal, round, and reactive to light.  Cardiovascular:     Rate and Rhythm: Normal rate and regular rhythm.     Heart sounds: Normal heart sounds.  Pulmonary:     Effort: Pulmonary effort is normal.     Breath sounds: Normal breath sounds.  Abdominal:     Tenderness: There is no right CVA tenderness or left CVA tenderness.  Musculoskeletal:     Right lower leg: No edema.     Left lower leg: No edema.  Skin:    General: Skin is warm and dry.     Capillary Refill: Capillary refill takes less than 2 seconds.  Neurological:     General: No focal deficit present.     Mental Status: She is alert and oriented to person, place, and time.  Psychiatric:        Mood and Affect: Mood normal.        Behavior: Behavior normal.        Thought Content: Thought content normal.        Judgment: Judgment normal.     Results for orders placed or performed in visit on 09/05/22  Microscopic Examination   Urine  Result Value Ref Range   WBC, UA 0-5 0 - 5 /hpf   RBC, Urine None seen 0 - 2 /hpf   Epithelial Cells (non renal) WILL FOLLOW    Renal Epithel, UA None seen None seen /hpf   Bacteria, UA Moderate (A) None seen/Few  Urinalysis, Routine w reflex microscopic   Result Value Ref Range   Specific Gravity, UA 1.020 1.005 - 1.030   pH, UA 7.5 5.0 - 7.5   Color, UA Yellow Yellow   Appearance Ur Clear Clear   Leukocytes,UA 1+ (A) Negative   Protein,UA Negative Negative/Trace   Glucose, UA Negative Negative   Ketones, UA Negative Negative   RBC, UA Negative Negative   Bilirubin, UA Negative Negative   Urobilinogen, Ur 0.2 0.2 - 1.0 mg/dL   Nitrite, UA Negative Negative   Microscopic Examination See below:        Pertinent labs & imaging results that were available during my care of the patient were reviewed by me and considered in my medical decision making.  Assessment & Plan:  Anita Kennedy was seen today for dysuria.  Diagnoses and all orders for this visit:  Urge incontinence of urine Urinalysis unremarkable. Provided Myrbetric 25 mg samples in office, enough for 4 weeks, will increase dosing to 50 mg after this is completed. Follow up in 4 weeks for reevaluation. Renal and hepatic functions checked today.  -     Urinalysis, Routine w reflex microscopic -     Urine Culture -     mirabegron ER (MYRBETRIQ) 50 MG TB24 tablet; Take 1 tablet (50 mg total) by mouth daily. -     Microscopic Examination  Obesity (BMI 30.0-34.9) -     CMP14+EGFR     Continue all other maintenance medications.  Follow up plan: Return in about 4 weeks (around 10/03/2022), or if symptoms worsen or fail to improve, for urge incontinence .   Continue healthy lifestyle choices, including diet (rich in fruits, vegetables, and lean proteins, and low in salt and simple carbohydrates) and exercise (at least 30 minutes of moderate physical activity daily).  Educational handout given for Kegel  The above assessment and management plan was discussed with the patient. The patient verbalized understanding of and has agreed to the management plan. Patient is aware  to call the clinic if they develop any new symptoms or if symptoms persist or worsen. Patient is aware when to  return to the clinic for a follow-up visit. Patient educated on when it is appropriate to go to the emergency department.   Monia Pouch, FNP-C [facility] [phone]

## 2022-09-06 ENCOUNTER — Telehealth: Payer: Self-pay | Admitting: Family Medicine

## 2022-09-06 LAB — LIPID PANEL
Chol/HDL Ratio: 2 ratio (ref 0.0–4.4)
Cholesterol, Total: 127 mg/dL (ref 100–199)
HDL: 62 mg/dL (ref >39)
LDL Chol Calc (NIH): 49 mg/dL (ref 0–99)
Triglycerides: 82 mg/dL (ref 0–149)
VLDL Cholesterol Cal: 16 mg/dL (ref 5–40)

## 2022-09-06 NOTE — Telephone Encounter (Signed)
Please let patient know we have newer samples if needed.  Do not take if expired.  She can dispose. There is no assistance for Myrbetriq

## 2022-09-06 NOTE — Telephone Encounter (Signed)
Pt aware.

## 2022-09-06 NOTE — Telephone Encounter (Signed)
Samples left for patient- wants Oxybutynin to be sent is possible

## 2022-09-07 LAB — URINE CULTURE

## 2022-09-08 ENCOUNTER — Other Ambulatory Visit: Payer: Self-pay | Admitting: Family Medicine

## 2022-09-08 DIAGNOSIS — N3941 Urge incontinence: Secondary | ICD-10-CM

## 2022-09-08 MED ORDER — OXYBUTYNIN CHLORIDE ER 5 MG PO TB24: 5.0000 mg | ORAL_TABLET | Freq: Every day | ORAL | 3 refills | Status: DC

## 2022-09-08 NOTE — Telephone Encounter (Signed)
I didn't actually see her for this but do let her know I sent in rx.  Please counsel her on red flags since this is a new med for her.

## 2022-09-08 NOTE — Telephone Encounter (Signed)
Patient can't afford myrbetriq--requesting RX for Oxybutynin  Will route to PCP

## 2022-09-13 ENCOUNTER — Encounter: Payer: Self-pay | Admitting: *Deleted

## 2022-10-04 ENCOUNTER — Ambulatory Visit (INDEPENDENT_AMBULATORY_CARE_PROVIDER_SITE_OTHER): Payer: Medicare Other | Admitting: Family Medicine

## 2022-10-04 ENCOUNTER — Encounter: Payer: Self-pay | Admitting: Family Medicine

## 2022-10-04 DIAGNOSIS — N3941 Urge incontinence: Secondary | ICD-10-CM | POA: Diagnosis not present

## 2022-10-04 NOTE — Progress Notes (Signed)
Telephone visit  Subjective: BS:[id number] incontinence PCP: Janora Norlander, DO Anita Kennedy is a 71 y.o. female calls for telephone consult today. Patient provides verbal consent for consult held via phone.  Due to COVID-19 pandemic this visit was conducted virtually. This visit type was conducted due to national recommendations for restrictions regarding the COVID-19 Pandemic (e.g. social distancing, sheltering in place) in an effort to limit this patient's exposure and mitigate transmission in our community. All issues noted in this document were discussed and addressed.  A physical exam was not performed with this format.   Location of patient: [place] Others present for call: none  1. Urinary incontinence Myrbetriq working but was too costly.  She still has a couple of weeks left of this medication and then she will transition over to the Ditropan which was sent into the pharmacy.  She is worried about the potential dry mouth with the triptan as she already suffers from that and has to be on some type of special toothpaste from her dentist.  She is switching to a different insurance company next year she is hopeful that they will perhaps cover the Myrbetriq and she will let me know if that needs to be called in instead.   ROS: Per HPI  Allergies  Allergen Reactions   Penicillins Hives   Nickel Rash   Past Medical History:  Diagnosis Date   Arthritis    Hyperlipidemia    Hypertension    Kidney stones    Plantar fasciitis of right foot     Current Outpatient Medications:    acetaminophen (TYLENOL) 650 MG CR tablet, Take 1,300 mg by mouth every 8 (eight) hours as needed for pain., Disp: , Rfl:    aspirin EC 81 MG tablet, Take 81 mg by mouth daily. Swallow whole., Disp: , Rfl:    lisinopril (ZESTRIL) 20 MG tablet, Take 1 tablet (20 mg total) by mouth daily., Disp: 90 tablet, Rfl: 3   meloxicam (MOBIC) 7.5 MG tablet, Take 1 tablet (7.5 mg total) by  mouth 2 (two) times daily., Disp: 60 tablet, Rfl: 1   Multiple Vitamin (MULTIVITAMIN) tablet, Take 1 tablet by mouth daily., Disp: , Rfl:    oxybutynin (DITROPAN XL) 5 MG 24 hr tablet, Take 1 tablet (5 mg total) by mouth at bedtime., Disp: 90 tablet, Rfl: 3   rosuvastatin (CRESTOR) 10 MG tablet, Take 1 tablet (10 mg total) by mouth daily. Dose change, Disp: 90 tablet, Rfl: 3   valACYclovir (VALTREX) 1000 MG tablet, Take 1 tablet (1,000 mg total) by mouth 2 (two) times daily., Disp: 20 tablet, Rfl: 0  Assessment/ Plan: 71 y.o. female   Urge incontinence of urine  Myrbetriq working well.  Has not yet started Ditropan.  Will let me know if she would like to continue Myrbetriq on her new insurance plan after the first of the year.  Start time: 9:58am (LVM), 10:10am End time: 10:16a  Total time spent on patient care (including telephone call/ virtual visit): 6 minutes  [place], [place] [phone]

## 2022-12-07 ENCOUNTER — Ambulatory Visit (INDEPENDENT_AMBULATORY_CARE_PROVIDER_SITE_OTHER): Payer: Medicare PPO

## 2022-12-07 VITALS — Ht 65.0 in | Wt 176.0 lb

## 2022-12-07 DIAGNOSIS — Z0000 Encounter for general adult medical examination without abnormal findings: Secondary | ICD-10-CM

## 2022-12-07 NOTE — Patient Instructions (Signed)
[name] , Thank you for taking time to come for your Medicare Wellness Visit. I appreciate your ongoing commitment to your health goals. Please review the following plan we discussed and let me know if I can assist you in the future.   These are the goals we discussed:  Goals       DIET - REDUCE CALORIE INTAKE      Would like to lose weight.      Patient Stated (pt-stated)      Pt would like to do more traveling this year      Volunteer (pt-stated)      Patient indicates that she wants to start volunteering 1-2 days per week at an organization of her choice.         This is a list of the screening recommended for you and due dates:  Health Maintenance  Topic Date Due   Zoster (Shingles) Vaccine (1 of 2) Never done   COVID-19 Vaccine (5 - 2023-24 season) 07/07/2022   Flu Shot  02/04/2023*   Mammogram  02/08/2023   Medicare Annual Wellness Visit  12/08/2023   Colon Cancer Screening  11/03/2026   DTaP/Tdap/Td vaccine (3 - Td or Tdap) 02/01/2030   Pneumonia Vaccine  Completed   DEXA scan (bone density measurement)  Completed   Hepatitis C Screening: USPSTF Recommendation to screen - Ages 61-79 yo.  Completed   HPV Vaccine  Aged Out  *Topic was postponed. The date shown is not the original due date.    Advanced directives: Advance directive discussed with you today. I have provided a copy for you to complete at home and have notarized. Once this is complete please bring a copy in to our office so we can scan it into your chart.   Conditions/risks identified: Aim for 30 minutes of exercise or brisk walking, 6-8 glasses of water, and 5 servings of fruits and vegetables each day.   Next appointment: Follow up in one year for your annual wellness visit    Preventive Care 65 Years and Older, Female Preventive care refers to lifestyle choices and visits with your health care provider that can promote health and wellness. What does preventive care include? A yearly physical exam.  This is also called an annual well check. Dental exams once or twice a year. Routine eye exams. Ask your health care provider how often you should have your eyes checked. Personal lifestyle choices, including: Daily care of your teeth and gums. Regular physical activity. Eating a healthy diet. Avoiding tobacco and drug use. Limiting alcohol use. Practicing safe sex. Taking low-dose aspirin every day. Taking vitamin and mineral supplements as recommended by your health care provider. What happens during an annual well check? The services and screenings done by your health care provider during your annual well check will depend on your age, overall health, lifestyle risk factors, and family history of disease. Counseling  Your health care provider may ask you questions about your: Alcohol use. Tobacco use. Drug use. Emotional well-being. Home and relationship well-being. Sexual activity. Eating habits. History of falls. Memory and ability to understand (cognition). Work and work Statistician. Reproductive health. Screening  You may have the following tests or measurements: Height, weight, and BMI. Blood pressure. Lipid and cholesterol levels. These may be checked every 5 years, or more frequently if you are over 100 years old. Skin check. Lung cancer screening. You may have this screening every year starting at age 51 if you have a 30-pack-year history of  smoking and currently smoke or have quit within the past 15 years. Fecal occult blood test (FOBT) of the stool. You may have this test every year starting at age 81. Flexible sigmoidoscopy or colonoscopy. You may have a sigmoidoscopy every 5 years or a colonoscopy every 10 years starting at age 48. Hepatitis C blood test. Hepatitis B blood test. Sexually transmitted disease (STD) testing. Diabetes screening. This is done by checking your blood sugar (glucose) after you have not eaten for a while (fasting). You may have this done  every 1-3 years. Bone density scan. This is done to screen for osteoporosis. You may have this done starting at age 110. Mammogram. This may be done every 1-2 years. Talk to your health care provider about how often you should have regular mammograms. Talk with your health care provider about your test results, treatment options, and if necessary, the need for more tests. Vaccines  Your health care provider may recommend certain vaccines, such as: Influenza vaccine. This is recommended every year. Tetanus, diphtheria, and acellular pertussis (Tdap, Td) vaccine. You may need a Td booster every 10 years. Zoster vaccine. You may need this after age 91. Pneumococcal 13-valent conjugate (PCV13) vaccine. One dose is recommended after age 16. Pneumococcal polysaccharide (PPSV23) vaccine. One dose is recommended after age 32. Talk to your health care provider about which screenings and vaccines you need and how often you need them. This information is not intended to replace advice given to you by your health care provider. Make sure you discuss any questions you have with your health care provider. Document Released: 11/19/2015 Document Revised: 07/12/2016 Document Reviewed: 08/24/2015 Elsevier Interactive Patient Education  2017 [employer] Prevention in the Home Falls can cause injuries. They can happen to people of all ages. There are many things you can do to make your home safe and to help prevent falls. What can I do on the outside of my home? Regularly fix the edges of walkways and driveways and fix any cracks. Remove anything that might make you trip as you walk through a door, such as a raised step or threshold. Trim any bushes or trees on the path to your home. Use bright outdoor lighting. Clear any walking paths of anything that might make someone trip, such as rocks or tools. Regularly check to see if handrails are loose or broken. Make sure that both sides of any steps have  handrails. Any raised decks and porches should have guardrails on the edges. Have any leaves, snow, or ice cleared regularly. Use sand or salt on walking paths during winter. Clean up any spills in your garage right away. This includes oil or grease spills. What can I do in the bathroom? Use night lights. Install grab bars by the toilet and in the tub and shower. Do not use towel bars as grab bars. Use non-skid mats or decals in the tub or shower. If you need to sit down in the shower, use a plastic, non-slip stool. Keep the floor dry. Clean up any water that spills on the floor as soon as it happens. Remove soap buildup in the tub or shower regularly. Attach bath mats securely with double-sided non-slip rug tape. Do not have throw rugs and other things on the floor that can make you trip. What can I do in the bedroom? Use night lights. Make sure that you have a light by your bed that is easy to reach. Do not use any sheets or blankets that  are too big for your bed. They should not hang down onto the floor. Have a firm chair that has side arms. You can use this for support while you get dressed. Do not have throw rugs and other things on the floor that can make you trip. What can I do in the kitchen? Clean up any spills right away. Avoid walking on wet floors. Keep items that you use a lot in easy-to-reach places. If you need to reach something above you, use a strong step stool that has a grab bar. Keep electrical cords out of the way. Do not use floor polish or wax that makes floors slippery. If you must use wax, use non-skid floor wax. Do not have throw rugs and other things on the floor that can make you trip. What can I do with my stairs? Do not leave any items on the stairs. Make sure that there are handrails on both sides of the stairs and use them. Fix handrails that are broken or loose. Make sure that handrails are as long as the stairways. Check any carpeting to make sure  that it is firmly attached to the stairs. Fix any carpet that is loose or worn. Avoid having throw rugs at the top or bottom of the stairs. If you do have throw rugs, attach them to the floor with carpet tape. Make sure that you have a light switch at the top of the stairs and the bottom of the stairs. If you do not have them, ask someone to add them for you. What else can I do to help prevent falls? Wear shoes that: Do not have high heels. Have rubber bottoms. Are comfortable and fit you well. Are closed at the toe. Do not wear sandals. If you use a stepladder: Make sure that it is fully opened. Do not climb a closed stepladder. Make sure that both sides of the stepladder are locked into place. Ask someone to hold it for you, if possible. Clearly mark and make sure that you can see: Any grab bars or handrails. First and last steps. Where the edge of each step is. Use tools that help you move around (mobility aids) if they are needed. These include: Canes. Walkers. Scooters. Crutches. Turn on the lights when you go into a dark area. Replace any light bulbs as soon as they burn out. Set up your furniture so you have a clear path. Avoid moving your furniture around. If any of your floors are uneven, fix them. If there are any pets around you, be aware of where they are. Review your medicines with your doctor. Some medicines can make you feel dizzy. This can increase your chance of falling. Ask your doctor what other things that you can do to help prevent falls. This information is not intended to replace advice given to you by your health care provider. Make sure you discuss any questions you have with your health care provider. Document Released: 08/19/2009 Document Revised: 03/30/2016 Document Reviewed: 11/27/2014 Elsevier Interactive Patient Education  2017 [employer].

## 2022-12-07 NOTE — Progress Notes (Signed)
Subjective:   Anita Kennedy is a 72 y.o. female who presents for Medicare Annual (Subsequent) preventive examination. I connected with  Freddrick March on 12/07/22 by a audio enabled telemedicine application and verified that I am speaking with the correct person using two identifiers.  Patient Location: Home  Provider Location: Home Office  I discussed the limitations of evaluation and management by telemedicine. The patient expressed understanding and agreed to proceed.  Review of Systems     Cardiac Risk Factors include: advanced age (>52mn, >>97women)     Objective:    Today's Vitals   12/07/22 0908  Weight: 176 lb (79.8 kg)  Height: '5\' 5"'$  (1.651 m)   Body mass index is 29.29 kg/m.     12/07/2022    9:10 AM 12/05/2021    8:26 AM 03/24/2019    3:35 PM 08/13/2017    9:14 AM 11/03/2016   12:08 PM  Advanced Directives  Does Patient Have a Medical Advance Directive? No No No No No  Would patient like information on creating a medical advance directive? No - Patient declined No - Patient declined No - Patient declined Yes (ED - Information included in AVS) No - Patient declined    Current Medications (verified) Outpatient Encounter Medications as of 12/07/2022  Medication Sig   acetaminophen (TYLENOL) 650 MG CR tablet Take 1,300 mg by mouth every 8 (eight) hours as needed for pain.   aspirin EC 81 MG tablet Take 81 mg by mouth daily. Swallow whole.   lisinopril (ZESTRIL) 20 MG tablet Take 1 tablet (20 mg total) by mouth daily.   meloxicam (MOBIC) 7.5 MG tablet Take 1 tablet (7.5 mg total) by mouth 2 (two) times daily.   Multiple Vitamin (MULTIVITAMIN) tablet Take 1 tablet by mouth daily.   oxybutynin (DITROPAN XL) 5 MG 24 hr tablet Take 1 tablet (5 mg total) by mouth at bedtime.   rosuvastatin (CRESTOR) 10 MG tablet Take 1 tablet (10 mg total) by mouth daily. Dose change   valACYclovir (VALTREX) 1000 MG tablet Take 1 tablet (1,000 mg total) by mouth 2 (two) times daily.    No facility-administered encounter medications on file as of 12/07/2022.    Allergies (verified) Penicillins and Nickel   History: Past Medical History:  Diagnosis Date   Arthritis    Hyperlipidemia    Hypertension    Kidney stones    Plantar fasciitis of right foot    Past Surgical History:  Procedure Laterality Date   BREAST CYST ASPIRATION Left    1990s   CESAREAN SECTION     CHOLECYSTECTOMY     COLONOSCOPY N/A 11/03/2016   Procedure: COLONOSCOPY;  Surgeon: SDanie Binder MD;  [facility];  Service: Endoscopy;  Laterality: N/A;  1;00 pm   MOLE REMOVAL     TONSILLECTOMY     Family History  Problem Relation Age of Onset   Heart disease Mother        Irregular heart beat   Alcohol abuse Father    Early death Father        Died of MI age 73  Heart disease Father 667  Hyperlipidemia Father    Hypertension Father    Cancer Maternal Grandmother    Depression Maternal Grandmother    Breast cancer Maternal Grandmother        unsure of age    Colon cancer Neg Hx    Social History   Socioeconomic History   Marital  status: Divorced    Spouse name: Not on file   Number of children: 9   Years of education: some college   Highest education level: High school graduate  Occupational History   Occupation: Retired  Tobacco Use   Smoking status: Former    Packs/day: 0.25    Types: Cigarettes    Quit date: 10/05/2013    Years since quitting: 9.1   Smokeless tobacco: Never  Vaping Use   Vaping Use: Never used  Substance and Sexual Activity   Alcohol use: No   Drug use: No   Sexual activity: Not Currently    Birth control/protection: Post-menopausal  Other Topics Concern   Not on file  Social History Narrative   6 children living.   9 grandchildren and 2 great grandchildren.   Social Determinants of Health   Financial Resource Strain: Low Risk  (12/07/2022)   Overall Financial Resource Strain (CARDIA)    Difficulty of Paying Living Expenses: Not  hard at all  Food Insecurity: No Food Insecurity (12/07/2022)   Hunger Vital Sign    Worried About Running Out of Food in the Last Year: Never true    Ran Out of Food in the Last Year: Never true  Transportation Needs: No Transportation Needs (12/07/2022)   PRAPARE - Hydrologist (Medical): No    Lack of Transportation (Non-Medical): No  Physical Activity: Insufficiently Active (12/07/2022)   Exercise Vital Sign    Days of Exercise per Week: 3 days    Minutes of Exercise per Session: 30 min  Stress: No Stress Concern Present (12/07/2022)   [place]    Feeling of Stress : Not at all  Social Connections: Moderately Isolated (12/07/2022)   Social Connection and Isolation Panel [NHANES]    Frequency of Communication with Friends and Family: More than three times a week    Frequency of Social Gatherings with Friends and Family: More than three times a week    Attends Religious Services: Never    Marine scientist or Organizations: No    Attends Music therapist: Never    Marital Status: Living with partner    Tobacco Counseling Counseling given: Not Answered   Clinical Intake:  Pre-visit preparation completed: Yes  Pain : No/denies pain     Nutritional Risks: None Diabetes: No  How often do you need to have someone help you when you read instructions, pamphlets, or other written materials from your doctor or pharmacy?: 1 - Never  Diabetic?no   Interpreter Needed?: No  Information entered by :: Jadene Pierini, LPN   Activities of Daily Living    12/07/2022    9:11 AM  In your present state of health, do you have any difficulty performing the following activities:  Hearing? 0  Vision? 0  Difficulty concentrating or making decisions? 0  Walking or climbing stairs? 0  Dressing or bathing? 0  Doing errands, shopping? 0  Preparing Food and eating ? N  Using the  Toilet? N  In the past six months, have you accidently leaked urine? N  Do you have problems with loss of bowel control? N  Managing your Medications? N  Managing your Finances? N  Housekeeping or managing your Housekeeping? N    Patient Care Team: Janora Norlander, DO as PCP - General (Family Medicine) Minus Breeding, MD as Consulting Physician (Cardiology)  Indicate any recent Medical Services you may have received  from other than [facility] providers in the past year (date may be approximate).     Assessment:   This is a routine wellness examination for [facility].  Hearing/Vision screen Vision Screening - Comments:: Wears rx glasses - up to date with routine eye exams with  Dr.Johnson   Dietary issues and exercise activities discussed: Current Exercise Habits: Home exercise routine, Type of exercise: walking, Time (Minutes): 30, Frequency (Times/Week): 3, Weekly Exercise (Minutes/Week): 90, Intensity: Mild, Exercise limited by: None identified   Goals Addressed             This Visit's Progress    DIET - REDUCE CALORIE INTAKE   On track    Would like to lose weight.       Depression Screen    12/07/2022    9:10 AM 09/05/2022    8:46 AM 03/30/2022   10:57 AM 02/06/2022   10:14 AM 12/05/2021    8:23 AM 10/19/2021    8:12 AM 10/03/2021   11:09 AM  PHQ 2/9 Scores  PHQ - 2 Score 0 0 0 0 0 0 0  PHQ- 9 Score  0    0 0    Fall Risk    12/07/2022    9:09 AM 09/05/2022    8:46 AM 03/30/2022   10:57 AM 02/06/2022   10:14 AM 12/05/2021    8:27 AM  [place] in the past year? 0 0 0 0 0  Number falls in past yr: 0    0  Injury with Fall? 0    0  Risk for fall due to : No Fall Risks    No Fall Risks  Follow up Falls prevention discussed    Falls prevention discussed    FALL RISK PREVENTION PERTAINING TO THE HOME:  Any stairs in or around the home? No  If so, are there any without handrails? No  Home free of loose throw rugs in walkways, pet beds, electrical cords, etc?  Yes  Adequate lighting in your home to reduce risk of falls? Yes   ASSISTIVE DEVICES UTILIZED TO PREVENT FALLS:  Life alert? No  Use of a cane, walker or w/c? No  Grab bars in the bathroom? Yes  Shower chair or bench in shower? Yes  Elevated toilet seat or a handicapped toilet? Yes       08/13/2017   11:27 AM  MMSE - Mini Mental State Exam  Orientation to time 5  Orientation to Place 5  Registration 3  Attention/ Calculation 5  Recall 2  Language- name 2 objects 2  Language- repeat 1  Language- follow 3 step command 3  Language- read & follow direction 1  Write a sentence 1  Copy design 1  Total score 29        12/07/2022    9:11 AM 12/05/2021    8:47 AM 12/05/2021    8:29 AM 03/24/2019    3:38 PM  6CIT Screen  What Year? 0 points 0 points 0 points 0 points  What month? 0 points 0 points 0 points 0 points  What time? 0 points 0 points 0 points 0 points  Count back from 20 0 points 0 points 0 points 0 points  Months in reverse 0 points 0 points 0 points 0 points  Repeat phrase 0 points 0 points 0 points 0 points  Total Score 0 points 0 points 0 points 0 points    Immunizations Immunization History  Administered Date(s) Administered  Fluad Quad(high Dose 65+) 07/09/2019   Hepatitis B, adult 06/25/2009, 11/11/2009   Influenza, High Dose Seasonal PF 08/01/2017, 08/05/2018   Influenza-Unspecified 07/27/2020, 07/05/2021   Moderna Sars-Covid-2 Vaccination 02/04/2020, 03/03/2020   PFIZER(Purple Top)SARS-COV-2 Vaccination 09/13/2020   Pfizer Covid-19 Vaccine Bivalent Booster 56yr & up 10/15/2021   Pneumococcal Conjugate-13 10/05/2016   Pneumococcal Polysaccharide-23 10/08/2017   Tdap 01/31/2010, 02/02/2020   Zoster, Live 10/05/2016    TDAP status: Up to date  Flu Vaccine status: Up to date  Pneumococcal vaccine status: Up to date  Covid-19 vaccine status: Completed vaccines  Qualifies for Shingles Vaccine? Yes   Zostavax completed No   Shingrix Completed?:  No.    Education has been provided regarding the importance of this vaccine. Patient has been advised to call insurance company to determine out of pocket expense if they have not yet received this vaccine. Advised may also receive vaccine at local pharmacy or Health Dept. Verbalized acceptance and understanding.  Screening Tests Health Maintenance  Topic Date Due   Zoster Vaccines- Shingrix (1 of 2) Never done   COVID-19 Vaccine (5 - 2023-24 season) 07/07/2022   INFLUENZA VACCINE  02/04/2023 (Originally 06/06/2022)   MAMMOGRAM  02/08/2023   Medicare Annual Wellness (AWV)  12/08/2023   COLONOSCOPY (Pts 45-471yrInsurance coverage will need to be confirmed)  11/03/2026   DTaP/Tdap/Td (3 - Td or Tdap) 02/01/2030   Pneumonia Vaccine 6527Years old  Completed   DEXA SCAN  Completed   Hepatitis C Screening  Completed   HPV VACCINES  Aged Out    Health Maintenance  Health Maintenance Due  Topic Date Due   Zoster Vaccines- Shingrix (1 of 2) Never done   COVID-19 Vaccine (5 - 2023-24 season) 07/07/2022    Colorectal cancer screening: Type of screening: Colonoscopy. Completed 11/03/2016. Repeat every 10 years  Mammogram status: Completed 02/07/2022. Repeat every year  Bone Density status: Completed 08/13/2017. Results reflect: Bone density results: OSTEOPENIA. Repeat every 5 years.  Lung Cancer Screening: (Low Dose CT Chest recommended if Age 73-80ears, 30 pack-year currently smoking OR have quit w/in 15years.) does not qualify.   Lung Cancer Screening Referral: n/a  Additional Screening:  Hepatitis C Screening: does not qualify;   Vision Screening: Recommended annual ophthalmology exams for early detection of glaucoma and other disorders of the eye. Is the patient up to date with their annual eye exam?  Yes  Who is the provider or what is the name of the office in which the patient attends annual eye exams? Dr.Johnson  If pt is not established with a provider, would they like to be  referred to a provider to establish care? No .   Dental Screening: Recommended annual dental exams for proper oral hygiene  Community Resource Referral / Chronic Care Management: CRR required this visit?  No   CCM required this visit?  No      Plan:     I have personally reviewed and noted the following in the patient's chart:   Medical and social history Use of alcohol, tobacco or illicit drugs  Current medications and supplements including opioid prescriptions. Patient is not currently taking opioid prescriptions. Functional ability and status Nutritional status Physical activity Advanced directives List of other physicians Hospitalizations, surgeries, and ER visits in previous 12 months Vitals Screenings to include cognitive, depression, and falls Referrals and appointments  In addition, I have reviewed and discussed with patient certain preventive protocols, quality metrics, and best practice recommendations. A written personalized care plan  for preventive services as well as general preventive health recommendations were provided to patient.     Daphane Shepherd, LPN   02/04/4238   Nurse Notes: Patient currently living in [place]

## 2023-02-01 ENCOUNTER — Other Ambulatory Visit: Payer: Self-pay | Admitting: Nurse Practitioner

## 2023-02-01 DIAGNOSIS — I10 Essential (primary) hypertension: Secondary | ICD-10-CM

## 2023-05-22 ENCOUNTER — Other Ambulatory Visit: Payer: Self-pay | Admitting: Family Medicine

## 2023-05-22 DIAGNOSIS — Z1231 Encounter for screening mammogram for malignant neoplasm of breast: Secondary | ICD-10-CM

## 2023-05-25 ENCOUNTER — Ambulatory Visit
Admission: RE | Admit: 2023-05-25 | Discharge: 2023-05-25 | Disposition: A | Discharge status: Home / Self Care | Payer: Medicare PPO | Source: Ambulatory Visit | Attending: Family Medicine | Admitting: Family Medicine

## 2023-05-25 DIAGNOSIS — Z1231 Encounter for screening mammogram for malignant neoplasm of breast: Secondary | ICD-10-CM

## 2023-06-06 ENCOUNTER — Ambulatory Visit: Payer: Medicare PPO | Admitting: Orthopaedic Surgery

## 2023-06-06 ENCOUNTER — Encounter: Payer: Self-pay | Admitting: Orthopaedic Surgery

## 2023-06-06 DIAGNOSIS — M1711 Unilateral primary osteoarthritis, right knee: Secondary | ICD-10-CM | POA: Diagnosis not present

## 2023-06-06 MED ORDER — LIDOCAINE HCL 1 % IJ SOLN
3.0000 mL | INTRAMUSCULAR | Status: AC | PRN
Start: 2023-06-06 — End: 2023-06-06
  Administered 2023-06-06: 3 mL

## 2023-06-06 MED ORDER — METHYLPREDNISOLONE ACETATE 40 MG/ML IJ SUSP
40.0000 mg | INTRAMUSCULAR | Status: AC | PRN
Start: 2023-06-06 — End: 2023-06-06
  Administered 2023-06-06: 40 mg via INTRA_ARTICULAR

## 2023-06-06 NOTE — Progress Notes (Signed)
Office Visit Note   Patient: Anita Kennedy           Date of Birth: April 23, 1951           MRN: [id number] Visit Date: 06/06/2023              Requested by: Raliegh Ip, DO [address] [place],  [place] [address] PCP: Raliegh Ip, DO   Assessment & Plan: Visit Diagnoses: No diagnosis found.  Plan: This point time she would like to reduce focus on the right knee.  I did discuss with her that her lack of being able to bring the left knee to full extension may be coming from her hip.  She states that hip just bothers her occasionally.  She would like to try a supplemental injection in her right knee approved.  Will try to gain approval and then have her back once the injections available.  Again she has tried oral medications, bracing, cortisone injections and PRP injections in the right knee.  She has no upcoming surgery planned for either knee in the next 6 months.  Radiographs do show tricompartmental arthritis with near bone-on-bone medial compartment and severe patellofemoral arthritis.  Quad strengthening exercises were reviewed with the patient.  Follow-Up Instructions: Return for Supplemental injection.   Orders:  Orders Placed This Encounter  Procedures   Large Joint Inj   No orders of the defined types were placed in this encounter.     Procedures: Large Joint Inj: R knee on 06/06/2023 11:12 AM Indications: pain Details: 22 G 1.5 in needle, anterolateral approach  Arthrogram: No  Medications: 3 mL lidocaine 1 %; 40 mg methylPREDNISolone acetate 40 MG/ML Outcome: tolerated well, no immediate complications Procedure, treatment alternatives, risks and benefits explained, specific risks discussed. Consent was given by the patient. Immediately prior to procedure a time out was called to verify the correct patient, procedure, equipment, support staff and site/side marked as required. Patient was prepped and draped in the usual sterile fashion.       Clinical  Data: No additional findings.   Subjective: Chief Complaint  Patient presents with   Right Knee - Pain    HPI Anita Kennedy 72 year old female who was last seen here in our office by Dr. Cleophas Dunker and [facility], PA-C in 2022.  At that time she was having knee pain and just had a cortisone injection by her primary care physician.  She has been in [place] for some time now.  Comes in today due to increased knee pain.  She has known tricompartmental arthritis of the right knee.  She denies any injury to either knee.  She does have some right knee pain but currently her left knee pain is worse.  She does not want to undergo any type of surgery for her left knee as wanting to try any type of conservative treatment.  She has tried Mobic and Tylenol arthritis without any relief.  She finds an Ace bandage to be more beneficial than the knee brace and regards to her knee pain.  She is asking about exercises and other injections possible treatment.  She has tried PRP injections in [place] and states these have given her good relief in the past.  Denies fevers chills.  Nondiabetic.  Review of Systems See HPI  Objective: Vital Signs: There were no vitals taken for this visit.  Physical Exam Constitutional:      Appearance: She is normal weight. She is not ill-appearing or diaphoretic.  Neurological:     Mental Status: She is alert and oriented to person, place, and time.  Psychiatric:        Mood and Affect: Mood normal.     Ortho Exam Bilateral knees: No abnormal warmth erythema or effusion.  No instability valgus varus stressing of either knee.  Sitting she has full extension of both knees.  Flexion to approximately 110 degrees bilaterally.  Lying down she is unable to straighten left knee lacking approximately 15 degrees.  Bilateral knees significant patellofemoral crepitus with passive range of motion.  Right knee tenderness along medial joint line. Bilateral hips fluid range of motion  of the right hip without pain.  She has limited internal/external rotation of the left hip with increased pain. Specialty Comments:  No specialty comments available.  Imaging: No results found.   PMFS History: Patient Active Problem List   Diagnosis Date Noted   Coronary artery disease involving native coronary artery of native heart without angina pectoris 06/12/2022   Aortic atherosclerosis (HCC) 06/12/2022   Precordial chest pain 03/28/2022   Splenic artery aneurysm (HCC) 10/24/2019   Cold sore 10/24/2019   Obesity 10/08/2017   Pre-diabetes 10/08/2017   Special screening for malignant neoplasms, colon    Hyperlipidemia 10/05/2016   Hyperglycemia 10/05/2016   Elevated blood pressure reading 10/05/2016   Past Medical History:  Diagnosis Date   Arthritis    Hyperlipidemia    Hypertension    Kidney stones    Plantar fasciitis of right foot     Family History  Problem Relation Age of Onset   Heart disease Mother        Irregular heart beat   Alcohol abuse Father    Early death Father        Died of MI age 25   Heart disease Father 31   Hyperlipidemia Father    Hypertension Father    Cancer Maternal Grandmother    Depression Maternal Grandmother    Breast cancer Maternal Grandmother        unsure of age    Colon cancer Neg Hx     Past Surgical History:  Procedure Laterality Date   BREAST CYST ASPIRATION Left    1990s   CESAREAN SECTION     CHOLECYSTECTOMY     COLONOSCOPY N/A 11/03/2016   Procedure: COLONOSCOPY;  Surgeon: West Bali, MD;  [facility];  Service: Endoscopy;  Laterality: N/A;  1;00 pm   MOLE REMOVAL     TONSILLECTOMY     Social History   Occupational History   Occupation: Retired  Tobacco Use   Smoking status: Former    Current packs/day: 0.00    Types: Cigarettes    Quit date: 10/05/2013    Years since quitting: 9.6   Smokeless tobacco: Never  Vaping Use   Vaping status: Never Used  Substance and Sexual Activity    Alcohol use: No   Drug use: No   Sexual activity: Not Currently    Birth control/protection: Post-menopausal

## 2023-07-11 ENCOUNTER — Ambulatory Visit (INDEPENDENT_AMBULATORY_CARE_PROVIDER_SITE_OTHER): Payer: Medicare PPO

## 2023-07-11 ENCOUNTER — Encounter: Payer: Self-pay | Admitting: Family Medicine

## 2023-07-11 ENCOUNTER — Ambulatory Visit (INDEPENDENT_AMBULATORY_CARE_PROVIDER_SITE_OTHER): Payer: Medicare PPO | Admitting: Family Medicine

## 2023-07-11 VITALS — BP 140/79 | HR 94 | Temp 98.9°F | Ht 65.0 in | Wt 177.0 lb

## 2023-07-11 DIAGNOSIS — N3941 Urge incontinence: Secondary | ICD-10-CM

## 2023-07-11 DIAGNOSIS — I10 Essential (primary) hypertension: Secondary | ICD-10-CM | POA: Diagnosis not present

## 2023-07-11 DIAGNOSIS — Z1211 Encounter for screening for malignant neoplasm of colon: Secondary | ICD-10-CM

## 2023-07-11 DIAGNOSIS — I2510 Atherosclerotic heart disease of native coronary artery without angina pectoris: Secondary | ICD-10-CM | POA: Diagnosis not present

## 2023-07-11 DIAGNOSIS — B009 Herpesviral infection, unspecified: Secondary | ICD-10-CM

## 2023-07-11 DIAGNOSIS — Z780 Asymptomatic menopausal state: Secondary | ICD-10-CM

## 2023-07-11 DIAGNOSIS — E7800 Pure hypercholesterolemia, unspecified: Secondary | ICD-10-CM | POA: Diagnosis not present

## 2023-07-11 DIAGNOSIS — R7303 Prediabetes: Secondary | ICD-10-CM | POA: Diagnosis not present

## 2023-07-11 LAB — BAYER DCA HB A1C WAIVED: HB A1C (BAYER DCA - WAIVED): 5.7 % — ABNORMAL HIGH (ref 4.8–5.6)

## 2023-07-11 MED ORDER — LISINOPRIL 20 MG PO TABS
20.0000 mg | ORAL_TABLET | Freq: Every day | ORAL | 3 refills | Status: DC
Start: 2023-07-11 — End: 2023-12-05

## 2023-07-11 MED ORDER — OXYBUTYNIN CHLORIDE ER 5 MG PO TB24
5.0000 mg | ORAL_TABLET | Freq: Every day | ORAL | 3 refills | Status: DC
Start: 2023-07-11 — End: 2023-12-05

## 2023-07-11 MED ORDER — ROSUVASTATIN CALCIUM 10 MG PO TABS
10.0000 mg | ORAL_TABLET | Freq: Every day | ORAL | 3 refills | Status: DC
Start: 2023-07-11 — End: 2023-12-05

## 2023-07-11 MED ORDER — VALACYCLOVIR HCL 1 G PO TABS: 1000.0000 mg | ORAL_TABLET | Freq: Two times a day (BID) | ORAL | 0 refills | Status: DC

## 2023-07-11 NOTE — Progress Notes (Signed)
Subjective: CC: Med refills PCP: Raliegh Ip, DO ION:GEXB V Anita Kennedy is a 72 y.o. female presenting to clinic today for:  1.  Hypertension associate with hyperlipidemia Compliant with lisinopril 20 mg daily, Crestor 10 mg daily.  No reports of chest pain, shortness of breath, dizziness  2.  Right knee pain Patient is under the care of Dr. Magnus Ivan for right knee pain.  Had corticosteroid shot which does seem to be helping slightly but they feel that she will likely need a knee replacement.  She was told that her hip would actually have to be addressed prior to her knee.  She notes that she is separated from her boyfriend since I saw her last because he has had a lot of medical problems and she is put herself on the back burner and now she is try to prioritize her own health.  She does report that she has a new grandbaby coming soon and [place] or her  3.  Overactive bladder Reports that she has not really taken the medication in a while now.  She just picked up a prescription.  Not sure if it is helpful or not because her symptoms have been okay without it  4.  Colon cancer screening Would like new referral to colon cancer provider in [place].  She did not proceed with colonoscopy 3 years ago because of all the medical issues her boyfriend was having but now that she is back she is trying to prioritize her self.  Denies any rectal bleeding, unplanned weight loss, night sweats, change in satiety etc.   ROS: Per HPI  Allergies  Allergen Reactions   Penicillins Hives   Nickel Rash   Past Medical History:  Diagnosis Date   Arthritis    Hyperlipidemia    Hypertension    Kidney stones    Plantar fasciitis of right foot     Current Outpatient Medications:    acetaminophen (TYLENOL) 650 MG CR tablet, Take 1,300 mg by mouth every 8 (eight) hours as needed for pain., Disp: , Rfl:    aspirin EC 81 MG tablet, Take 81 mg by mouth daily. Swallow  whole., Disp: , Rfl:    Ginger, Zingiber officinalis, (GINGER ROOT) 550 MG CAPS, Take by mouth daily., Disp: , Rfl:    L-Lysine 1000 MG TABS, Take by mouth daily., Disp: , Rfl:    lisinopril (ZESTRIL) 20 MG tablet, Take 1 tablet (20 mg total) by mouth daily., Disp: 90 tablet, Rfl: 3   Multiple Vitamin (MULTIVITAMIN) tablet, Take 1 tablet by mouth daily., Disp: , Rfl:    Multiple Vitamins-Minerals (CENTRUM ADULT 50+ MULTIGUMMIES PO), Take by mouth daily., Disp: , Rfl:    oxybutynin (DITROPAN XL) 5 MG 24 hr tablet, Take 1 tablet (5 mg total) by mouth at bedtime., Disp: 90 tablet, Rfl: 3   rosuvastatin (CRESTOR) 10 MG tablet, Take 1 tablet (10 mg total) by mouth daily. Dose change, Disp: 90 tablet, Rfl: 3   Turmeric 03-999 MG CAPS, Take 2 capsules by mouth daily., Disp: , Rfl:    valACYclovir (VALTREX) 1000 MG tablet, Take 1 tablet (1,000 mg total) by mouth 2 (two) times daily., Disp: 20 tablet, Rfl: 0 Social History   Socioeconomic History   Marital status: Divorced    Spouse name: Not on file   Number of children: 9   Years of education: some college   Highest education level: 12th grade  Occupational History  Occupation: Retired  Tobacco Use   Smoking status: Former    Current packs/day: 0.00    Types: Cigarettes    Quit date: 10/05/2013    Years since quitting: 9.7   Smokeless tobacco: Never  Vaping Use   Vaping status: Never Used  Substance and Sexual Activity   Alcohol use: No   Drug use: No   Sexual activity: Not Currently    Birth control/protection: Post-menopausal  Other Topics Concern   Not on file  Social History Narrative   6 children living.   9 grandchildren and 2 great grandchildren.   Social Determinants of Health   Financial Resource Strain: Medium Risk (07/09/2023)   Overall Financial Resource Strain (CARDIA)    Difficulty of Paying Living Expenses: Somewhat hard  Food Insecurity: No Food Insecurity (07/09/2023)   Hunger Vital Sign    Worried About Running  Out of Food in the Last Year: Never true    Ran Out of Food in the Last Year: Never true  Transportation Needs: No Transportation Needs (07/09/2023)   PRAPARE - Administrator, Civil Service (Medical): No    Lack of Transportation (Non-Medical): No  Physical Activity: Sufficiently Active (07/09/2023)   Exercise Vital Sign    Days of Exercise per Week: 6 days    Minutes of Exercise per Session: 150+ min  Stress: No Stress Concern Present (07/09/2023)   [employer] of Occupational Health - Occupational Stress Questionnaire    Feeling of Stress : Only a little  Social Connections: Moderately Isolated (07/09/2023)   Social Connection and Isolation Panel [NHANES]    Frequency of Communication with Friends and Family: More than three times a week    Frequency of Social Gatherings with Friends and Family: More than three times a week    Attends Religious Services: More than 4 times per year    Active Member of [employer] or Organizations: No    Attends Banker Meetings: Never    Marital Status: Divorced  Catering manager Violence: Not At Risk (12/07/2022)   Humiliation, Afraid, Rape, and Kick questionnaire    Fear of Current or Ex-Partner: No    Emotionally Abused: No    Physically Abused: No    Sexually Abused: No   Family History  Problem Relation Age of Onset   Heart disease Mother        Irregular heart beat   Alcohol abuse Father    Early death Father        Died of MI age 78   Heart disease Father 30   Hyperlipidemia Father    Hypertension Father    Cancer Maternal Grandmother    Depression Maternal Grandmother    Breast cancer Maternal Grandmother        unsure of age    Colon cancer Neg Hx     Objective: Office vital signs reviewed. BP (!) 140/79   Pulse 94   Temp 98.9 F (37.2 C)   Ht 5\' 5"  (1.651 m)   Wt 177 lb (80.3 kg)   SpO2 (!) 79%   BMI 29.45 kg/m   Physical Examination:  General: Awake, alert, well nourished, No acute distress HEENT:  sclera white, MMM Cardio: regular rate and rhythm, S1S2 heard, no murmurs appreciated Pulm: clear to auscultation bilaterally, no wheezes, rhonchi or rales; normal work of breathing on room air   Assessment/ Plan: 72 y.o. 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  May resume if finds this to be helpful  Labs were collected today.  Continue statin.  Check A1c given history of prediabetes  Blood pressure controlled.  No changes needed.  Refill sent  Valacyclovir renewed for as needed flareup.  Has not had any indication of HSV-2 but would like to have this evaluated.  She has known HSV 1  Referral to gastroenterology for colon cancer screening placed   Aeron Lheureux Hulen Skains, DO [facility] [phone]

## 2023-07-12 ENCOUNTER — Encounter: Payer: Self-pay | Admitting: *Deleted

## 2023-07-12 LAB — CMP14+EGFR
ALT: 15 IU/L (ref 0–32)
AST: 19 IU/L (ref 0–40)
Albumin: 4.5 g/dL (ref 3.8–4.8)
Alkaline Phosphatase: 96 IU/L (ref 44–121)
BUN/Creatinine Ratio: 26 (ref 12–28)
BUN: 19 mg/dL (ref 8–27)
Bilirubin Total: 0.4 mg/dL (ref 0.0–1.2)
CO2: 22 mmol/L (ref 20–29)
Calcium: 9.8 mg/dL (ref 8.7–10.3)
Chloride: 105 mmol/L (ref 96–106)
Creatinine, Ser: 0.73 mg/dL (ref 0.57–1.00)
Globulin, Total: 2.3 g/dL (ref 1.5–4.5)
Glucose: 95 mg/dL (ref 70–99)
Potassium: 4.6 mmol/L (ref 3.5–5.2)
Sodium: 141 mmol/L (ref 134–144)
Total Protein: 6.8 g/dL (ref 6.0–8.5)
eGFR: 87 mL/min/{1.73_m2} (ref >59)

## 2023-07-12 LAB — LIPID PANEL
Chol/HDL Ratio: 2.2 ratio (ref 0.0–4.4)
Cholesterol, Total: 145 mg/dL (ref 100–199)
HDL: 67 mg/dL (ref >39)
LDL Chol Calc (NIH): 61 mg/dL (ref 0–99)
Triglycerides: 91 mg/dL (ref 0–149)
VLDL Cholesterol Cal: 17 mg/dL (ref 5–40)

## 2023-07-12 LAB — TSH: TSH: 1.24 u[IU]/mL (ref 0.450–4.500)

## 2023-07-12 LAB — HSV 1 AND 2 AB, IGG
HSV 1 Glycoprotein G Ab, IgG: 46.2 {index} — ABNORMAL HIGH (ref 0.00–0.90)
HSV 2 IgG, Type Spec: <0.91 {index} (ref 0.00–0.90)

## 2023-08-30 ENCOUNTER — Telehealth: Payer: Self-pay | Admitting: Internal Medicine

## 2023-08-30 NOTE — Telephone Encounter (Signed)
Questionnaire in review

## 2023-09-04 NOTE — Telephone Encounter (Signed)
  Procedure: colonoscopy  Height: 175lbs Weight: 5'5        Have you had a colonoscopy before?  11/03/16, Dr. Darrick Penna  Do you have family history of colon cancer?  no  Do you have a family history of polyps? no  Previous colonoscopy with polyps removed? yes  Do you have a history colorectal cancer?   no  Are you diabetic?  no  Do you have a prosthetic or mechanical heart valve? no  Do you have a pacemaker/defibrillator?   no  Have you had endocarditis/atrial fibrillation?  no  Do you use supplemental oxygen/CPAP?  no  Have you had joint replacement within the last 12 months?  no  Do you tend to be constipated or have to use laxatives?  no   Do you have history of alcohol use? If yes, how much and how often.  no  Do you have history or are you using drugs? If yes, what do are you  using?  no  Have you ever had a stroke/heart attack?  no  Have you ever had a heart or other vascular stent placed,?no  Do you take weight loss medication? no  female patients,: have you had a hysterectomy? no                              are you post menopausal?                                do you still have your menstrual cycle? no    Date of last menstrual period?   Do you take any blood-thinning medications such as: (Plavix, aspirin, Coumadin, Aggrenox, Brilinta, Xarelto, Eliquis, Pradaxa, Savaysa or Effient)? Aspirin 81mg   If yes we need the name, milligram, dosage and who is prescribing doctor:               Current Outpatient Medications  Medication Sig Dispense Refill   acetaminophen (TYLENOL) 650 MG CR tablet Take 1,300 mg by mouth every 8 (eight) hours as needed for pain.     aspirin EC 81 MG tablet Take 81 mg by mouth daily. Swallow whole.     Ginger, Zingiber officinalis, (GINGER ROOT) 550 MG CAPS Take by mouth daily.     L-Lysine 1000 MG TABS Take by mouth daily.     lisinopril (ZESTRIL) 20 MG tablet Take 1 tablet (20 mg total) by mouth daily. 90 tablet 3   Multiple Vitamin  (MULTIVITAMIN) tablet Take 1 tablet by mouth daily.     Multiple Vitamins-Minerals (CENTRUM ADULT 50+ MULTIGUMMIES PO) Take by mouth daily.     oxybutynin (DITROPAN XL) 5 MG 24 hr tablet Take 1 tablet (5 mg total) by mouth at bedtime. 90 tablet 3   rosuvastatin (CRESTOR) 10 MG tablet Take 1 tablet (10 mg total) by mouth daily. Dose change 90 tablet 3   Turmeric 03-999 MG CAPS Take 2 capsules by mouth daily.     valACYclovir (VALTREX) 1000 MG tablet Take 1 tablet (1,000 mg total) by mouth 2 (two) times daily. 20 tablet 0   No current facility-administered medications for this visit.    Allergies  Allergen Reactions   Penicillins Hives   Nickel Rash

## 2023-09-05 NOTE — Telephone Encounter (Signed)
Ok to schedule. ASA 2.  

## 2023-09-06 ENCOUNTER — Encounter: Payer: Self-pay | Admitting: *Deleted

## 2023-09-06 ENCOUNTER — Other Ambulatory Visit: Payer: Self-pay | Admitting: *Deleted

## 2023-09-06 MED ORDER — PEG 3350-KCL-NA BICARB-NACL 420 G PO SOLR: 4000.0000 mL | Freq: Once | ORAL | 0 refills | Status: AC

## 2023-09-06 NOTE — Telephone Encounter (Signed)
Pt has been scheduled for 10/09/23 with Dr.Carver. instructions  mailed and prep sent to the pharmacy

## 2023-09-06 NOTE — Telephone Encounter (Signed)
[employer] PA: Approved Authorization #[id number]  Tracking #[id number] Dates of service 10/09/2023 - 01/07/2024

## 2023-09-06 NOTE — Telephone Encounter (Signed)
Referral completed, [facility] apt letter sent to PCP

## 2023-10-08 ENCOUNTER — Ambulatory Visit (INDEPENDENT_AMBULATORY_CARE_PROVIDER_SITE_OTHER): Payer: Medicare PPO | Admitting: Orthopaedic Surgery

## 2023-10-08 DIAGNOSIS — M25561 Pain in right knee: Secondary | ICD-10-CM

## 2023-10-08 DIAGNOSIS — G8929 Other chronic pain: Secondary | ICD-10-CM | POA: Diagnosis not present

## 2023-10-08 DIAGNOSIS — M1711 Unilateral primary osteoarthritis, right knee: Secondary | ICD-10-CM

## 2023-10-08 MED ORDER — METHYLPREDNISOLONE ACETATE 40 MG/ML IJ SUSP
40.0000 mg | INTRAMUSCULAR | Status: AC | PRN
Start: 2023-10-08 — End: 2023-10-08
  Administered 2023-10-08: 40 mg via INTRA_ARTICULAR

## 2023-10-08 MED ORDER — LIDOCAINE HCL 1 % IJ SOLN
3.0000 mL | INTRAMUSCULAR | Status: AC | PRN
Start: 2023-10-08 — End: 2023-10-08
  Administered 2023-10-08: 3 mL

## 2023-10-08 NOTE — Progress Notes (Signed)
The patient is here today requesting a steroid injection in her right knee to treat the pain from longstanding osteoarthritis.  She last had an injection just over 4 months ago.  That is helped quite a bit.  She has had no acute change in her medical status.  She is still not ready for knee replacement surgery she states.  On exam her right knee shows medial joint line tenderness and some patellofemoral crepitation but excellent range of motion and it is ligamentously stable.  Per her request we did place a steroid injection in her right knee which she tolerated well.  She knows to wait at least 3 to 4 months between injections.  If he gets worse enough or she would consider knee replacement surgery but she is not there yet.  She remains active.    Procedure Note  Patient: Anita Kennedy             Date of Birth: 02/12/1951           MRN: [id number]             Visit Date: 10/08/2023  Procedures: Visit Diagnoses:  1. Primary osteoarthritis of right knee   2. Chronic pain of right knee     Large Joint Inj: R knee on 10/08/2023 10:32 AM Indications: diagnostic evaluation and pain Details: 22 G 1.5 in needle, superolateral approach  Arthrogram: No  Medications: 3 mL lidocaine 1 %; 40 mg methylPREDNISolone acetate 40 MG/ML Outcome: tolerated well, no immediate complications Procedure, treatment alternatives, risks and benefits explained, specific risks discussed. Consent was given by the patient. Immediately prior to procedure a time out was called to verify the correct patient, procedure, equipment, support staff and site/side marked as required. Patient was prepped and draped in the usual sterile fashion.

## 2023-10-09 ENCOUNTER — Ambulatory Visit (HOSPITAL_COMMUNITY): Payer: Medicare PPO | Admitting: Certified Registered"

## 2023-10-09 ENCOUNTER — Ambulatory Visit (HOSPITAL_COMMUNITY)
Admission: RE | Admit: 2023-10-09 | Discharge: 2023-10-09 | Disposition: A | Discharge status: Home / Self Care | Payer: Medicare PPO | Attending: Internal Medicine | Admitting: Internal Medicine

## 2023-10-09 ENCOUNTER — Other Ambulatory Visit: Payer: Self-pay

## 2023-10-09 ENCOUNTER — Encounter (HOSPITAL_COMMUNITY)
Admission: RE | Disposition: A | Discharge status: Home / Self Care | Payer: Self-pay | Source: Home / Self Care | Attending: Internal Medicine

## 2023-10-09 ENCOUNTER — Encounter (HOSPITAL_COMMUNITY): Payer: Self-pay

## 2023-10-09 DIAGNOSIS — D122 Benign neoplasm of ascending colon: Secondary | ICD-10-CM

## 2023-10-09 DIAGNOSIS — Q438 Other specified congenital malformations of intestine: Secondary | ICD-10-CM | POA: Insufficient documentation

## 2023-10-09 DIAGNOSIS — K635 Polyp of colon: Secondary | ICD-10-CM

## 2023-10-09 DIAGNOSIS — Z860101 Personal history of adenomatous and serrated colon polyps: Secondary | ICD-10-CM

## 2023-10-09 DIAGNOSIS — I10 Essential (primary) hypertension: Secondary | ICD-10-CM | POA: Diagnosis not present

## 2023-10-09 DIAGNOSIS — K648 Other hemorrhoids: Secondary | ICD-10-CM | POA: Diagnosis not present

## 2023-10-09 DIAGNOSIS — I209 Angina pectoris, unspecified: Secondary | ICD-10-CM | POA: Insufficient documentation

## 2023-10-09 DIAGNOSIS — Z1211 Encounter for screening for malignant neoplasm of colon: Secondary | ICD-10-CM

## 2023-10-09 DIAGNOSIS — Z860100 Personal history of colon polyps, unspecified: Secondary | ICD-10-CM

## 2023-10-09 DIAGNOSIS — K5730 Diverticulosis of large intestine without perforation or abscess without bleeding: Secondary | ICD-10-CM | POA: Diagnosis not present

## 2023-10-09 DIAGNOSIS — Z5986 Financial insecurity: Secondary | ICD-10-CM | POA: Insufficient documentation

## 2023-10-09 DIAGNOSIS — Z87891 Personal history of nicotine dependence: Secondary | ICD-10-CM | POA: Diagnosis not present

## 2023-10-09 HISTORY — PX: POLYPECTOMY: SHX5525

## 2023-10-09 HISTORY — PX: COLONOSCOPY WITH PROPOFOL: SHX5780

## 2023-10-09 SURGERY — COLONOSCOPY WITH PROPOFOL
Anesthesia: General

## 2023-10-09 MED ORDER — LIDOCAINE HCL (CARDIAC) PF 100 MG/5ML IV SOSY
PREFILLED_SYRINGE | INTRAVENOUS | Status: DC | PRN
  Administered 2023-10-09: 60 mg via INTRAVENOUS

## 2023-10-09 MED ORDER — LIDOCAINE HCL (PF) 2 % IJ SOLN
INTRAMUSCULAR | Status: AC
  Filled 2023-10-09: qty 15

## 2023-10-09 MED ORDER — LACTATED RINGERS IV SOLN: INTRAVENOUS | Status: DC | PRN

## 2023-10-09 MED ORDER — PROPOFOL 10 MG/ML IV BOLUS
INTRAVENOUS | Status: DC | PRN
  Administered 2023-10-09: 80 mg via INTRAVENOUS
  Administered 2023-10-09 (×2): 40 mg via INTRAVENOUS

## 2023-10-09 MED ORDER — PHENYLEPHRINE 80 MCG/ML (10ML) SYRINGE FOR IV PUSH (FOR BLOOD PRESSURE SUPPORT)
PREFILLED_SYRINGE | INTRAVENOUS | Status: AC
  Filled 2023-10-09: qty 10

## 2023-10-09 MED ORDER — PHENYLEPHRINE 80 MCG/ML (10ML) SYRINGE FOR IV PUSH (FOR BLOOD PRESSURE SUPPORT)
PREFILLED_SYRINGE | INTRAVENOUS | Status: DC | PRN
  Administered 2023-10-09: 160 ug via INTRAVENOUS

## 2023-10-09 MED ORDER — PROPOFOL 500 MG/50ML IV EMUL
INTRAVENOUS | Status: DC | PRN
  Administered 2023-10-09: 150 ug/kg/min via INTRAVENOUS

## 2023-10-09 MED ORDER — LIDOCAINE HCL (PF) 2 % IJ SOLN
INTRAMUSCULAR | Status: AC
  Filled 2023-10-09: qty 5

## 2023-10-09 MED ORDER — PROPOFOL 1000 MG/100ML IV EMUL
INTRAVENOUS | Status: AC
Start: 2023-10-09 — End: ?
  Filled 2023-10-09: qty 100

## 2023-10-09 NOTE — Transfer of Care (Addendum)
Immediate Anesthesia Transfer of Care Note  Patient: Anita Kennedy  Procedure(s) Performed: COLONOSCOPY WITH PROPOFOL POLYPECTOMY  Patient Location: Endoscopy Unit  Anesthesia Type:General  Level of Consciousness: drowsy and patient cooperative  Airway & Oxygen Therapy: Patient Spontanous Breathing and Patient connected to nasal cannula oxygen  Post-op Assessment: Report given to RN and Post -op Vital signs reviewed and stable  Post vital signs: Reviewed and stable  Last Vitals:  Vitals Value Taken Time  BP 111/57 10/09/23   1153  Temp 36.6 10/09/23   1148  Pulse 63 10/09/23   1148  Resp 13 10/09/23   1148  SpO2 96% 10/09/23   1148    Last Pain:  Vitals:   10/09/23 1121  TempSrc:   PainSc: 0-No pain      Patients Stated Pain Goal: 7 (10/09/23 1023)  Complications: No notable events documented.

## 2023-10-09 NOTE — Discharge Instructions (Addendum)
  Colonoscopy Discharge Instructions  Read the instructions outlined below and refer to this sheet in the next few weeks. These discharge instructions provide you with general information on caring for yourself after you leave the hospital. Your doctor may also give you specific instructions. While your treatment has been planned according to the most current medical practices available, unavoidable complications occasionally occur.   ACTIVITY You may resume your regular activity, but move at a slower pace for the next 24 hours.  Take frequent rest periods for the next 24 hours.  Walking will help get rid of the air and reduce the bloated feeling in your belly (abdomen).  No driving for 24 hours (because of the medicine (anesthesia) used during the test).   Do not sign any important legal documents or operate any machinery for 24 hours (because of the anesthesia used during the test).  NUTRITION Drink plenty of fluids.  You may resume your normal diet as instructed by your doctor.  Begin with a light meal and progress to your normal diet. Heavy or fried foods are harder to digest and may make you feel sick to your stomach (nauseated).  Avoid alcoholic beverages for 24 hours or as instructed.  MEDICATIONS You may resume your normal medications unless your doctor tells you otherwise.  WHAT YOU CAN EXPECT TODAY Some feelings of bloating in the abdomen.  Passage of more gas than usual.  Spotting of blood in your stool or on the toilet paper.  IF YOU HAD POLYPS REMOVED DURING THE COLONOSCOPY: No aspirin products for 7 days or as instructed.  No alcohol for 7 days or as instructed.  Eat a soft diet for the next 24 hours.  FINDING OUT THE RESULTS OF YOUR TEST Not all test results are available during your visit. If your test results are not back during the visit, make an appointment with your caregiver to find out the results. Do not assume everything is normal if you have not heard from your  caregiver or the medical facility. It is important for you to follow up on all of your test results.  SEEK IMMEDIATE MEDICAL ATTENTION IF: You have more than a spotting of blood in your stool.  Your belly is swollen (abdominal distention).  You are nauseated or vomiting.  You have a temperature over 101.  You have abdominal pain or discomfort that is severe or gets worse throughout the day.   Your colonoscopy revealed 2 polyp(s) which I removed successfully. Await pathology results, my office will contact you. I recommend repeating colonoscopy in 5 years for surveillance purposes. Otherwise follow up with GI as needed.    I hope you have a great rest of your week!  Hennie Duos. Marletta Lor, D.O. Gastroenterology and Hepatology [facility]

## 2023-10-09 NOTE — H&P (Signed)
Primary Care Physician:  Raliegh Ip, DO Primary Gastroenterologist:  Dr. Marletta Lor  Pre-Procedure History & Physical: HPI:  Anita Kennedy is a 73 y.o. female is here for a colonoscopy to be performed for surveillance purposes, personal history of adenomatous colon polyps in 2017  Past Medical History:  Diagnosis Date   Arthritis    Hyperlipidemia    Hypertension    Kidney stones    Plantar fasciitis of right foot     Past Surgical History:  Procedure Laterality Date   BREAST CYST ASPIRATION Left    1990s   CESAREAN SECTION     CHOLECYSTECTOMY     COLONOSCOPY N/A 11/03/2016   Procedure: COLONOSCOPY;  Surgeon: West Bali, MD;  Location: [facility];  Service: Endoscopy;  Laterality: N/A;  1;00 pm   MOLE REMOVAL     TONSILLECTOMY      Prior to Admission medications   Medication Sig Start Date End Date Taking? Authorizing Provider  aspirin EC 81 MG tablet Take 81 mg by mouth daily. Swallow whole.   Yes [provider]  co-enzyme Q-10 30 MG capsule Take 30 mg by mouth daily.   Yes [provider]  Ginger, Zingiber officinalis, (GINGER ROOT) 550 MG CAPS Take by mouth daily.   Yes [provider]  L-Lysine 1000 MG TABS Take by mouth daily.   Yes [provider]  lisinopril (ZESTRIL) 20 MG tablet Take 1 tablet (20 mg total) by mouth daily. 07/11/23  Yes Delynn Flavin M, DO  Multiple Vitamin (MULTIVITAMIN) tablet Take 1 tablet by mouth daily.   Yes [provider]  Multiple Vitamins-Minerals (CENTRUM ADULT 50+ MULTIGUMMIES PO) Take by mouth daily.   Yes [provider]  rosuvastatin (CRESTOR) 10 MG tablet Take 1 tablet (10 mg total) by mouth daily. Dose change 07/11/23  Yes Gottschalk, Ashly M, DO  Turmeric 03-999 MG CAPS Take 2 capsules by mouth daily.   Yes [provider]  valACYclovir (VALTREX) 1000 MG tablet Take 1 tablet (1,000 mg total) by mouth 2 (two) times daily. 07/11/23  Yes Delynn Flavin M, DO   acetaminophen (TYLENOL) 650 MG CR tablet Take 1,300 mg by mouth every 8 (eight) hours as needed for pain.    [provider]  oxybutynin (DITROPAN XL) 5 MG 24 hr tablet Take 1 tablet (5 mg total) by mouth at bedtime. Patient taking differently: Take 5 mg by mouth at bedtime. 07/11/23   Raliegh Ip, DO    Allergies as of 09/06/2023 - Review Complete 09/04/2023  Allergen Reaction Noted   Penicillins Hives 10/05/2016   Nickel Rash 10/05/2016    Family History  Problem Relation Age of Onset   Heart disease Mother        Irregular heart beat   Alcohol abuse Father    Early death Father        Died of MI age 15   Heart disease Father 17   Hyperlipidemia Father    Hypertension Father    Cancer Maternal Grandmother    Depression Maternal Grandmother    Breast cancer Maternal Grandmother        unsure of age    Colon cancer Neg Hx     Social History   Socioeconomic History   Marital status: Divorced    Spouse name: Not on file   Number of children: 9   Years of education: some college   Highest education level: 12th grade  Occupational History   Occupation:  Retired  Tobacco Use   Smoking status: Former    Current packs/day: 0.00    Types: Cigarettes    Quit date: 10/05/2013    Years since quitting: 10.0   Smokeless tobacco: Never  Vaping Use   Vaping status: Never Used  Substance and Sexual Activity   Alcohol use: No   Drug use: No   Sexual activity: Not Currently    Birth control/protection: Post-menopausal  Other Topics Concern   Not on file  Social History Narrative   6 children living.   9 grandchildren and 2 great grandchildren.   Social Determinants of Health   Financial Resource Strain: Medium Risk (07/09/2023)   Overall Financial Resource Strain (CARDIA)    Difficulty of Paying Living Expenses: Somewhat hard  Food Insecurity: No Food Insecurity (07/09/2023)   Hunger Vital Sign    Worried About Running Out of Food in the Last Year: Never  true    Ran Out of Food in the Last Year: Never true  Transportation Needs: No Transportation Needs (07/09/2023)   PRAPARE - Administrator, Civil Service (Medical): No    Lack of Transportation (Non-Medical): No  Physical Activity: Sufficiently Active (07/09/2023)   Exercise Vital Sign    Days of Exercise per Week: 6 days    Minutes of Exercise per Session: 150+ min  Stress: No Stress Concern Present (07/09/2023)   [employer] of Occupational Health - Occupational Stress Questionnaire    Feeling of Stress : Only a little  Social Connections: Moderately Isolated (07/09/2023)   Social Connection and Isolation Panel [NHANES]    Frequency of Communication with Friends and Family: More than three times a week    Frequency of Social Gatherings with Friends and Family: More than three times a week    Attends Religious Services: More than 4 times per year    Active Member of [employer] or Organizations: No    Attends Banker Meetings: Never    Marital Status: Divorced  Catering manager Violence: Not At Risk (12/07/2022)   Humiliation, Afraid, Rape, and Kick questionnaire    Fear of Current or Ex-Partner: No    Emotionally Abused: No    Physically Abused: No    Sexually Abused: No    Review of Systems: See HPI, otherwise negative ROS  Physical Exam: Vital signs in last 24 hours: Temp:  [97.9 F (36.6 C)] 97.9 F (36.6 C) (12/03 1031) Pulse Rate:  [69] 69 (12/03 1031) Resp:  [21] 21 (12/03 1031) BP: (159)/(72) 159/72 (12/03 1031) SpO2:  [97 %] 97 % (12/03 1031) Weight:  [73.5 kg] 73.5 kg (12/03 1023)   General:   Alert,  Well-developed, well-nourished, pleasant and cooperative in NAD Head:  Normocephalic and atraumatic. Eyes:  Sclera clear, no icterus.   Conjunctiva pink. Ears:  Normal auditory acuity. Nose:  No deformity, discharge,  or lesions. Msk:  Symmetrical without gross deformities. Normal posture. Extremities:  Without clubbing or edema. Neurologic:   Alert and  oriented x4;  grossly normal neurologically. Skin:  Intact without significant lesions or rashes. Psych:  Alert and cooperative. Normal mood and affect.  Impression/Plan: Anita Kennedy is here for a colonoscopy to be performed for surveillance purposes, personal history of adenomatous colon polyps in 2017  The risks of the procedure including infection, bleed, or perforation as well as benefits, limitations, alternatives and imponderables have been reviewed with the patient. Questions have been answered. All parties agreeable.

## 2023-10-09 NOTE — Anesthesia Preprocedure Evaluation (Addendum)
Anesthesia Evaluation  Patient identified by MRN, date of birth, ID band Patient awake    Reviewed: Allergy & Precautions, H&P , NPO status , Patient's Chart, lab work & pertinent test results, reviewed documented beta blocker date and time   Airway Mallampati: II  TM Distance: >3 FB Neck ROM: full    Dental no notable dental hx. (+) Dental Advisory Given, Teeth Intact   Pulmonary former smoker   Pulmonary exam normal breath sounds clear to auscultation       Cardiovascular Exercise Tolerance: Good hypertension, + angina  Normal cardiovascular exam Rhythm:regular Rate:Normal  Had angina but significant CAD ruled out according to her   Neuro/Psych negative neurological ROS  negative psych ROS   GI/Hepatic negative GI ROS, Neg liver ROS,,,  Endo/Other  negative endocrine ROS    Renal/GU negative Renal ROS  negative genitourinary   Musculoskeletal  (+) Arthritis , Osteoarthritis,    Abdominal   Peds  Hematology negative hematology ROS (+)   Anesthesia Other Findings   Reproductive/Obstetrics negative OB ROS                             Anesthesia Physical Anesthesia Plan  ASA: 3  Anesthesia Plan: General   Post-op Pain Management: Minimal or no pain anticipated   Induction: Intravenous  PONV Risk Score and Plan: Propofol infusion  Airway Management Planned: Nasal Cannula and Natural Airway  Additional Equipment: None  Intra-op Plan:   Post-operative Plan:   Informed Consent: I have reviewed the patients History and Physical, chart, labs and discussed the procedure including the risks, benefits and alternatives for the proposed anesthesia with the patient or authorized representative who has indicated his/her understanding and acceptance.     Dental Advisory Given  Plan Discussed with: CRNA  Anesthesia Plan Comments:         Anesthesia Quick Evaluation

## 2023-10-09 NOTE — Op Note (Signed)
[facility] Patient Name: Anita Kennedy Procedure Date: 10/09/2023 11:04 AM MRN: [id number] Date of Birth: November 24, 1950 Attending MD: Hennie Duos. Marletta Lor , [place], [id number] CSN: [id number] Age: 72 Admit Type: Outpatient Procedure:                Colonoscopy Indications:              Surveillance: Personal history of colonic polyps                            (unknown histology) on last colonoscopy more than 5                            years ago Providers:                Hennie Duos. Marletta Lor, DO, Buel Ream. Thomasena Edis RN, RN,                            Zena Amos Referring MD:              Medicines:                See the Anesthesia note for documentation of the                            administered medications Complications:            No immediate complications. Estimated Blood Loss:     Estimated blood loss was minimal. Procedure:                Pre-Anesthesia Assessment:                           - The anesthesia plan was to use monitored                            anesthesia care (MAC).                           After obtaining informed consent, the colonoscope                            was passed under direct vision. Throughout the                            procedure, the patient's blood pressure, pulse, and                            oxygen saturations were monitored continuously. The                            [id number]-HQ190L ([id number]) scope was introduced through                            the anus and advanced to the the cecum, identified                            by appendiceal orifice and ileocecal valve.  The                            colonoscopy was technically difficult and complex                            due to a redundant colon and significant looping.                            Successful completion of the procedure was aided by                            straightening and shortening the scope to obtain                            bowel loop reduction. The patient  tolerated the                            procedure well. The quality of the bowel                            preparation was evaluated using the BBPS [facility]                            Bowel Preparation Scale) with scores of: Right                            Colon = 3, Transverse Colon = 3 and Left Colon = 3                            (entire mucosa seen well with no residual staining,                            small fragments of stool or opaque liquid). The                            total BBPS score equals 9. Scope In: 11:23:39 AM Scope Out: 11:42:27 AM Scope Withdrawal Time: 0 hours 11 minutes 59 seconds  Total Procedure Duration: 0 hours 18 minutes 48 seconds  Findings:      Non-bleeding internal hemorrhoids were found.      Multiple medium-mouthed and small-mouthed diverticula were found in the       sigmoid colon.      An 8 mm polyp was found in the ascending colon. The polyp was sessile.       The polyp was removed with a cold snare. Resection and retrieval were       complete.      A 7 mm polyp was found in the transverse colon. The polyp was sessile.       The polyp was removed with a cold snare. Resection and retrieval were       complete.      The exam was otherwise without abnormality. Impression:               - Non-bleeding internal hemorrhoids.                           -  Diverticulosis in the sigmoid colon.                           - One 8 mm polyp in the ascending colon, removed                            with a cold snare. Resected and retrieved.                           - One 7 mm polyp in the transverse colon, removed                            with a cold snare. Resected and retrieved.                           - The examination was otherwise normal. Moderate Sedation:      Per Anesthesia Care Recommendation:           - Patient has a contact number available for                            emergencies. The signs and symptoms of potential                             delayed complications were discussed with the                            patient. Return to normal activities tomorrow.                            Written discharge instructions were provided to the                            patient.                           - Resume previous diet.                           - Continue present medications.                           - Await pathology results.                           - Repeat colonoscopy in 5 years for surveillance.                           - Return to GI clinic PRN. Procedure Code(s):        --- Professional ---                           [phone], Colonoscopy, flexible; with removal of                            tumor(s), polyp(s), or other  lesion(s) by snare                            technique Diagnosis Code(s):        --- Professional ---                           Z86.010, Personal history of colonic polyps                           K64.8, Other hemorrhoids                           D12.2, Benign neoplasm of ascending colon                           D12.3, Benign neoplasm of transverse colon (hepatic                            flexure or splenic flexure)                           K57.30, Diverticulosis of large intestine without                            perforation or abscess without bleeding CPT copyright 2022 American Medical Association. All rights reserved. The codes documented in this report are preliminary and upon coder review may  be revised to meet current compliance requirements. Hennie Duos. Marletta Lor, DO Hennie Duos. Marletta Lor, DO 10/09/2023 11:46:22 AM This report has been signed electronically. Number of Addenda: 0

## 2023-10-09 NOTE — Anesthesia Postprocedure Evaluation (Signed)
Anesthesia Post Note  Patient: Anita Kennedy  Procedure(s) Performed: COLONOSCOPY WITH PROPOFOL POLYPECTOMY  Patient location during evaluation: PACU Anesthesia Type: General Level of consciousness: awake and alert Pain management: pain level controlled Vital Signs Assessment: post-procedure vital signs reviewed and stable Respiratory status: spontaneous breathing, nonlabored ventilation, respiratory function stable and patient connected to nasal cannula oxygen Cardiovascular status: blood pressure returned to baseline and stable Postop Assessment: no apparent nausea or vomiting Anesthetic complications: no   There were no known notable events for this encounter.   Last Vitals:  Vitals:   10/09/23 1148 10/09/23 1153  BP: (!) 98/49 (!) 111/57  Pulse: 63 68  Resp: 13 18  Temp: 36.6 C   SpO2: 96% 97%    Last Pain:  Vitals:   10/09/23 1153  TempSrc:   PainSc: 3                  Furkan Keenum L Harlem Bula

## 2023-10-10 LAB — SURGICAL PATHOLOGY

## 2023-10-17 ENCOUNTER — Encounter (HOSPITAL_COMMUNITY): Payer: Self-pay | Admitting: Internal Medicine

## 2023-12-05 ENCOUNTER — Ambulatory Visit: Payer: Medicare PPO | Admitting: Family Medicine

## 2023-12-05 ENCOUNTER — Encounter: Payer: Self-pay | Admitting: Family Medicine

## 2023-12-05 VITALS — BP 134/80 | HR 80 | Temp 97.9°F | Ht 66.0 in | Wt 184.0 lb

## 2023-12-05 DIAGNOSIS — E66811 Obesity, class 1: Secondary | ICD-10-CM

## 2023-12-05 DIAGNOSIS — Z0001 Encounter for general adult medical examination with abnormal findings: Secondary | ICD-10-CM

## 2023-12-05 DIAGNOSIS — B009 Herpesviral infection, unspecified: Secondary | ICD-10-CM

## 2023-12-05 DIAGNOSIS — E7800 Pure hypercholesterolemia, unspecified: Secondary | ICD-10-CM | POA: Diagnosis not present

## 2023-12-05 DIAGNOSIS — R7303 Prediabetes: Secondary | ICD-10-CM

## 2023-12-05 DIAGNOSIS — I2510 Atherosclerotic heart disease of native coronary artery without angina pectoris: Secondary | ICD-10-CM | POA: Diagnosis not present

## 2023-12-05 DIAGNOSIS — Z23 Encounter for immunization: Secondary | ICD-10-CM

## 2023-12-05 DIAGNOSIS — I728 Aneurysm of other specified arteries: Secondary | ICD-10-CM | POA: Diagnosis not present

## 2023-12-05 DIAGNOSIS — I10 Essential (primary) hypertension: Secondary | ICD-10-CM

## 2023-12-05 DIAGNOSIS — R2242 Localized swelling, mass and lump, left lower limb: Secondary | ICD-10-CM | POA: Diagnosis not present

## 2023-12-05 DIAGNOSIS — Z0000 Encounter for general adult medical examination without abnormal findings: Secondary | ICD-10-CM

## 2023-12-05 LAB — CBC
Hematocrit: 44.7 % (ref 34.0–46.6)
Hemoglobin: 14.3 g/dL (ref 11.1–15.9)
MCH: 29.3 pg (ref 26.6–33.0)
MCHC: 32 g/dL (ref 31.5–35.7)
MCV: 92 fL (ref 79–97)
Platelets: 356 10*3/uL (ref 150–450)
RBC: 4.88 x10E6/uL (ref 3.77–5.28)
RDW: 12.9 % (ref 11.7–15.4)
WBC: 9 10*3/uL (ref 3.4–10.8)

## 2023-12-05 LAB — BAYER DCA HB A1C WAIVED: HB A1C (BAYER DCA - WAIVED): 5.2 % (ref 4.8–5.6)

## 2023-12-05 MED ORDER — ROSUVASTATIN CALCIUM 10 MG PO TABS: 10.0000 mg | ORAL_TABLET | Freq: Every day | ORAL | 3 refills | Status: AC

## 2023-12-05 MED ORDER — LISINOPRIL 20 MG PO TABS: 20.0000 mg | ORAL_TABLET | Freq: Every day | ORAL | 3 refills | Status: AC

## 2023-12-05 MED ORDER — VALACYCLOVIR HCL 1 G PO TABS: 2000.0000 mg | ORAL_TABLET | Freq: Two times a day (BID) | ORAL | 0 refills | Status: DC

## 2023-12-05 NOTE — Progress Notes (Signed)
Anita Kennedy is a 73 y.o. female presents to office today for annual physical exam examination.    Concerns today include: 1.  Spot on leg She reports that she has a cystic lesion on the left anterior leg that has been present for a while.  She noticed it when she was wearing boots.  The boots rub against it.  She has history of some type of skin cancer that occurred on her face and she just wanted to make sure this was not cancerous.  It is tender to touch.  Occupation: Retired, Marital status: Single Health Maintenance Due  Topic Date Due   Zoster Vaccines- Shingrix (1 of 2) 08/07/2000   COVID-19 Vaccine (5 - 2024-25 season) 07/08/2023   Medicare Annual Wellness (AWV)  12/08/2023   Refills needed today: Lisinopril, Valtrex  Immunization History  Administered Date(s) Administered   Fluad Quad(high Dose 65+) 07/09/2019   Hepatitis B, ADULT 06/25/2009, 11/11/2009   Influenza, High Dose Seasonal PF 08/01/2017, 08/05/2018   Influenza-Unspecified 07/27/2020, 07/05/2021   Moderna Sars-Covid-2 Vaccination 02/04/2020, 03/03/2020   PFIZER(Purple Top)SARS-COV-2 Vaccination 09/13/2020   Pfizer Covid-19 Vaccine Bivalent Booster 50yrs & up 10/15/2021   Pneumococcal Conjugate-13 10/05/2016   Pneumococcal Polysaccharide-23 10/08/2017   Tdap 01/31/2010, 02/02/2020   Zoster, Live 10/05/2016   Past Medical History:  Diagnosis Date   Arthritis    Hyperlipidemia    Hypertension    Kidney stones    Plantar fasciitis of right foot    Social History   Socioeconomic History   Marital status: Divorced    Spouse name: Not on file   Number of children: 9   Years of education: some college   Highest education level: 12th grade  Occupational History   Occupation: Retired  Tobacco Use   Smoking status: Former    Current packs/day: 0.00    Types: Cigarettes    Quit date: 10/05/2013    Years since quitting: 10.1   Smokeless tobacco: Never  Vaping Use   Vaping status: Never Used   Substance and Sexual Activity   Alcohol use: No   Drug use: No   Sexual activity: Not Currently    Birth control/protection: Post-menopausal  Other Topics Concern   Not on file  Social History Narrative   6 children living.   9 grandchildren and 2 great grandchildren.   Social Drivers of Health   Financial Resource Strain: Medium Risk (07/09/2023)   Overall Financial Resource Strain (CARDIA)    Difficulty of Paying Living Expenses: Somewhat hard  Food Insecurity: No Food Insecurity (07/09/2023)   Hunger Vital Sign    Worried About Running Out of Food in the Last Year: Never true    Ran Out of Food in the Last Year: Never true  Transportation Needs: No Transportation Needs (07/09/2023)   PRAPARE - Administrator, Civil Service (Medical): No    Lack of Transportation (Non-Medical): No  Physical Activity: Sufficiently Active (07/09/2023)   Exercise Vital Sign    Days of Exercise per Week: 6 days    Minutes of Exercise per Session: 150+ min  Stress: No Stress Concern Present (07/09/2023)   [employer] of Occupational Health - Occupational Stress Questionnaire    Feeling of Stress : Only a little  Social Connections: Moderately Isolated (07/09/2023)   Social Connection and Isolation Panel [NHANES]    Frequency of Communication with Friends and Family: More than three times a week    Frequency of Social Gatherings with Friends and  Family: More than three times a week    Attends Religious Services: More than 4 times per year    Active Member of Clubs or Organizations: No    Attends Banker Meetings: Never    Marital Status: Divorced  Catering manager Violence: Not At Risk (12/07/2022)   Humiliation, Afraid, Rape, and Kick questionnaire    Fear of Current or Ex-Partner: No    Emotionally Abused: No    Physically Abused: No    Sexually Abused: No   Past Surgical History:  Procedure Laterality Date   BREAST CYST ASPIRATION Left    1990s   CESAREAN SECTION      CHOLECYSTECTOMY     COLONOSCOPY N/A 11/03/2016   Procedure: COLONOSCOPY;  Surgeon: West Bali, MD;  Location: [facility];  Service: Endoscopy;  Laterality: N/A;  1;00 pm   COLONOSCOPY WITH PROPOFOL N/A 10/09/2023   Procedure: COLONOSCOPY WITH PROPOFOL;  Surgeon: Lanelle Bal, DO;  Location: [facility];  Service: Endoscopy;  Laterality: N/A;  11:45 am, asa 2   MOLE REMOVAL     POLYPECTOMY  10/09/2023   Procedure: POLYPECTOMY;  Surgeon: Lanelle Bal, DO;  Location: [facility];  Service: Endoscopy;;   TONSILLECTOMY     Family History  Problem Relation Age of Onset   Heart disease Mother        Irregular heart beat   Alcohol abuse Father    Early death Father        Died of MI age 74   Heart disease Father 53   Hyperlipidemia Father    Hypertension Father    Cancer Maternal Grandmother    Depression Maternal Grandmother    Breast cancer Maternal Grandmother        unsure of age    Colon cancer Neg Hx     Current Outpatient Medications:    acetaminophen (TYLENOL) 650 MG CR tablet, Take 1,300 mg by mouth every 8 (eight) hours as needed for pain., Disp: , Rfl:    aspirin EC 81 MG tablet, Take 81 mg by mouth daily. Swallow whole., Disp: , Rfl:    co-enzyme Q-10 30 MG capsule, Take 30 mg by mouth daily., Disp: , Rfl:    Ginger, Zingiber officinalis, (GINGER ROOT) 550 MG CAPS, Take by mouth daily., Disp: , Rfl:    L-Lysine 1000 MG TABS, Take by mouth daily., Disp: , Rfl:    lisinopril (ZESTRIL) 20 MG tablet, Take 1 tablet (20 mg total) by mouth daily., Disp: 90 tablet, Rfl: 3   Multiple Vitamin (MULTIVITAMIN) tablet, Take 1 tablet by mouth daily., Disp: , Rfl:    Multiple Vitamins-Minerals (CENTRUM ADULT 50+ MULTIGUMMIES PO), Take by mouth daily., Disp: , Rfl:    oxybutynin (DITROPAN XL) 5 MG 24 hr tablet, Take 1 tablet (5 mg total) by mouth at bedtime. (Patient taking differently: Take 5 mg by mouth at bedtime.), Disp: 90 tablet, Rfl: 3   rosuvastatin (CRESTOR) 10  MG tablet, Take 1 tablet (10 mg total) by mouth daily. Dose change, Disp: 90 tablet, Rfl: 3   Turmeric 03-999 MG CAPS, Take 2 capsules by mouth daily., Disp: , Rfl:    valACYclovir (VALTREX) 1000 MG tablet, Take 1 tablet (1,000 mg total) by mouth 2 (two) times daily., Disp: 20 tablet, Rfl: 0  Allergies  Allergen Reactions   Penicillins Hives   Nickel Rash     ROS: Review of Systems Pertinent items noted in HPI and remainder of comprehensive ROS  otherwise negative.    Physical exam BP 134/80   Pulse 80   Temp 97.9 F (36.6 C)   Ht 5\' 6"  (1.676 m)   Wt 184 lb (83.5 kg)   SpO2 95%   BMI 29.70 kg/m  General appearance: alert, cooperative, appears stated age, and no distress Head: Normocephalic, without obvious abnormality, atraumatic Eyes: negative findings: lids and lashes normal, conjunctivae and sclerae normal, corneas clear, and pupils equal, round, reactive to light and accomodation Ears: normal TM's and external ear canals both ears Nose: Nares normal. Septum midline. Mucosa normal. No drainage or sinus tenderness. Throat: lips, mucosa, and tongue normal; teeth and gums normal Neck: no adenopathy, supple, symmetrical, trachea midline, and thyroid not enlarged, symmetric, no tenderness/mass/nodules Back: symmetric, no curvature. ROM normal. No CVA tenderness. Lungs: clear to auscultation bilaterally Heart: regular rate and rhythm, S1, S2 normal, no murmur, click, rub or gallop Abdomen: soft, non-tender; bowel sounds normal; no masses,  no organomegaly Extremities:  Marble size nodule noted along the left anterior mid shin. Pulses: 2+ and symmetric Skin:  as above Lymph nodes: Cervical, supraclavicular, and axillary nodes normal. Neurologic: Grossly normal      12/05/2023    9:54 AM 12/05/2023    9:52 AM 07/11/2023    2:48 PM  Depression screen PHQ 2/9  Decreased Interest 0 0 0  Down, Depressed, Hopeless 0 0 0  PHQ - 2 Score 0 0 0  Altered sleeping 0  0  Tired,  decreased energy 0  0  Change in appetite 0  0  Feeling bad or failure about yourself  0  0  Trouble concentrating 0  0  Moving slowly or fidgety/restless 0  0  Suicidal thoughts 0  0  PHQ-9 Score 0  0  Difficult doing work/chores Not difficult at all  Not difficult at all      12/05/2023    9:54 AM 07/11/2023    2:48 PM 09/05/2022    8:46 AM 03/30/2022   10:58 AM  GAD 7 : Generalized Anxiety Score  Nervous, Anxious, on Edge 0 0 0 0  Control/stop worrying 0 0 0 0  Worry too much - different things 0 0 0 0  Trouble relaxing 0 0 0 0  Restless 0 0 0 0  Easily annoyed or irritable 0 0 0 0  Afraid - awful might happen 0 0 0 0  Total GAD 7 Score 0 0 0 0  Anxiety Difficulty Not difficult at all Not difficult at all Not difficult at all Not difficult at all     Assessment/ Plan: Marlan Palau here for annual physical exam.   Annual physical exam  Pure hypercholesterolemia - Plan: rosuvastatin (CRESTOR) 10 MG tablet  Coronary artery disease involving native coronary artery of native heart without angina pectoris - Plan: rosuvastatin (CRESTOR) 10 MG tablet  Primary hypertension - Plan: lisinopril (ZESTRIL) 20 MG tablet  Pre-diabetes - Plan: Bayer DCA Hb A1c Waived  Obesity (BMI 30.0-34.9)  Splenic artery aneurysm (HCC) - Plan: CBC  HSV infection - Plan: valACYclovir (VALTREX) 1000 MG tablet  Nodule of skin of left lower leg  Not due for fasting labs as these were obtained in September.  She will continue current regimen with Crestor.  This has been renewed  Blood pressure controlled.  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  I question may be erythema nodosum given tenderness.  Discussed use of topical NSAID versus oral NSAID though would certainly try and limit oral NSAIDs given history of CAD.  Plan for referral to  dermatology if persistent or getting larger  First shingles vaccination administered  Counseled on healthy lifestyle choices, including diet (rich in fruits, vegetables and lean meats and low in salt and simple carbohydrates) and exercise (at least 30 minutes of moderate physical activity daily).  Patient to follow up 1 year for annual physical, sooner if concerns arise  Delma Drone M. Nadine Counts, DO

## 2023-12-07 ENCOUNTER — Encounter: Payer: Self-pay | Admitting: Family Medicine

## 2023-12-07 DIAGNOSIS — B009 Herpesviral infection, unspecified: Secondary | ICD-10-CM

## 2023-12-10 ENCOUNTER — Ambulatory Visit (INDEPENDENT_AMBULATORY_CARE_PROVIDER_SITE_OTHER): Payer: Medicare PPO

## 2023-12-10 VITALS — Ht 66.0 in | Wt 184.0 lb

## 2023-12-10 DIAGNOSIS — Z0000 Encounter for general adult medical examination without abnormal findings: Secondary | ICD-10-CM

## 2023-12-10 NOTE — Patient Instructions (Signed)
Anita Kennedy , Thank you for taking time to come for your Medicare Wellness Visit. I appreciate your ongoing commitment to your health goals. Please review the following plan we discussed and let me know if I can assist you in the future.   Referrals/Orders/Follow-Ups/Clinician Recommendations: Aim for 30 minutes of exercise or brisk walking, 6-8 glasses of water, and 5 servings of fruits and vegetables each day.  This is a list of the screening recommended for you and due dates:  Health Maintenance  Topic Date Due   COVID-19 Vaccine (5 - 2024-25 season) 07/08/2023   Flu Shot  02/04/2024*   Zoster (Shingles) Vaccine (2 of 2) 01/30/2024   Mammogram  05/24/2024   Medicare Annual Wellness Visit  12/09/2024   DEXA scan (bone density measurement)  07/10/2025   DTaP/Tdap/Td vaccine (3 - Td or Tdap) 02/01/2030   Colon Cancer Screening  10/08/2033   Pneumonia Vaccine  Completed   Hepatitis C Screening  Completed   HPV Vaccine  Aged Out  *Topic was postponed. The date shown is not the original due date.    Advanced directives: (ACP Link)Information on Advanced Care Planning can be found at [facility] Advance Health Care Directives Advance Health Care Directives ([email or web address]   Next Medicare Annual Wellness Visit scheduled for next year: Yes

## 2023-12-10 NOTE — Progress Notes (Signed)
Subjective:   Anita Kennedy is a 73 y.o. female who presents for Medicare Annual (Subsequent) preventive examination.  Visit Complete: Virtual I connected with  Marlan Palau on 12/10/23 by a audio enabled telemedicine application and verified that I am speaking with the correct person using two identifiers.  Patient Location: Home  Provider Location: Home Office  This patient declined Interactive audio and video telecommunications. Therefore the visit was completed with audio only.  I discussed the limitations of evaluation and management by telemedicine. The patient expressed understanding and agreed to proceed.  Vital Signs: Because this visit was a virtual/telehealth visit, some criteria may be missing or patient reported. Any vitals not documented were not able to be obtained and vitals that have been documented are patient reported.  Cardiac Risk Factors include: advanced age (>60men, >48 women);dyslipidemia     Objective:    Today's Vitals   12/10/23 1150  Weight: 184 lb (83.5 kg)  Height: 5\' 6"  (1.676 m)   Body mass index is 29.7 kg/m.     12/10/2023   11:58 AM 10/09/2023   10:19 AM 12/07/2022    9:10 AM 12/05/2021    8:26 AM 03/24/2019    3:35 PM 08/13/2017    9:14 AM 11/03/2016   12:08 PM  Advanced Directives  Does Patient Have a Medical Advance Directive? No No No No No No No  Would patient like information on creating a medical advance directive? Yes (MAU/Ambulatory/Procedural Areas - Information given) No - Patient declined No - Patient declined No - Patient declined No - Patient declined Yes (ED - Information included in AVS) No - Patient declined    Current Medications (verified) Outpatient Encounter Medications as of 12/10/2023  Medication Sig   acetaminophen (TYLENOL) 650 MG CR tablet Take 1,300 mg by mouth every 8 (eight) hours as needed for pain.   aspirin EC 81 MG tablet Take 81 mg by mouth daily. Swallow whole.   Cholecalciferol (VITAMIN D3) 125 MCG  (5000 UT) CAPS Take by mouth daily.   co-enzyme Q-10 30 MG capsule Take 30 mg by mouth daily.   Ginger, Zingiber officinalis, (GINGER ROOT) 550 MG CAPS Take by mouth daily.   L-Lysine 1000 MG TABS Take by mouth daily.   lisinopril (ZESTRIL) 20 MG tablet Take 1 tablet (20 mg total) by mouth daily.   Multiple Vitamin (MULTIVITAMIN) tablet Take 1 tablet by mouth daily.   Multiple Vitamins-Minerals (CENTRUM ADULT 50+ MULTIGUMMIES PO) Take by mouth daily.   Omega-3 1000 MG CAPS daily.   rosuvastatin (CRESTOR) 10 MG tablet Take 1 tablet (10 mg total) by mouth daily. Dose change   Turmeric 400 MG CAPS daily.   Turmeric 03-999 MG CAPS Take 2 capsules by mouth daily.   valACYclovir (VALTREX) 1000 MG tablet Take 2 tablets (2,000 mg total) by mouth in the morning and at bedtime. X1 day per flare up of cold sores   No facility-administered encounter medications on file as of 12/10/2023.    Allergies (verified) Penicillins and Nickel   History: Past Medical History:  Diagnosis Date   Arthritis    Hyperlipidemia    Hypertension    Kidney stones    Plantar fasciitis of right foot    Past Surgical History:  Procedure Laterality Date   BREAST CYST ASPIRATION Left    1990s   CESAREAN SECTION     CHOLECYSTECTOMY     COLONOSCOPY N/A 11/03/2016   Procedure: COLONOSCOPY;  Surgeon: West Bali, MD;  [facility]:  [facility];  Service: Endoscopy;  Laterality: N/A;  1;00 pm   COLONOSCOPY WITH PROPOFOL N/A 10/09/2023   Procedure: COLONOSCOPY WITH PROPOFOL;  Surgeon: Lanelle Bal, DO;  Location: [facility];  Service: Endoscopy;  Laterality: N/A;  11:45 am, asa 2   MOLE REMOVAL     POLYPECTOMY  10/09/2023   Procedure: POLYPECTOMY;  Surgeon: Lanelle Bal, DO;  Location: [facility];  Service: Endoscopy;;   TONSILLECTOMY     Family History  Problem Relation Age of Onset   Heart disease Mother        Irregular heart beat   Alcohol abuse Father    Early death Father        Died of MI  age 57   Heart disease Father 27   Hyperlipidemia Father    Hypertension Father    Cancer Maternal Grandmother    Depression Maternal Grandmother    Breast cancer Maternal Grandmother        unsure of age    Colon cancer Neg Hx    Social History   Socioeconomic History   Marital status: Divorced    Spouse name: Not on file   Number of children: 9   Years of education: some college   Highest education level: 12th grade  Occupational History   Occupation: Retired  Tobacco Use   Smoking status: Former    Current packs/day: 0.00    Types: Cigarettes    Quit date: 10/05/2013    Years since quitting: 10.1   Smokeless tobacco: Never  Vaping Use   Vaping status: Never Used  Substance and Sexual Activity   Alcohol use: No   Drug use: No   Sexual activity: Not Currently    Birth control/protection: Post-menopausal  Other Topics Concern   Not on file  Social History Narrative   6 children living.   9 grandchildren and 2 great grandchildren.   Social Drivers of Corporate investment banker Strain: Low Risk  (12/10/2023)   Overall Financial Resource Strain (CARDIA)    Difficulty of Paying Living Expenses: Not very hard  Recent Concern: Financial Resource Strain - Medium Risk (12/05/2023)   Overall Financial Resource Strain (CARDIA)    Difficulty of Paying Living Expenses: Somewhat hard  Food Insecurity: No Food Insecurity (12/10/2023)   Hunger Vital Sign    Worried About Running Out of Food in the Last Year: Never true    Ran Out of Food in the Last Year: Never true  Transportation Needs: No Transportation Needs (12/10/2023)   PRAPARE - Administrator, Civil Service (Medical): No    Lack of Transportation (Non-Medical): No  Physical Activity: Sufficiently Active (12/10/2023)   Exercise Vital Sign    Days of Exercise per Week: 6 days    Minutes of Exercise per Session: 60 min  Stress: No Stress Concern Present (12/10/2023)   [employer] of Occupational Health -  Occupational Stress Questionnaire    Feeling of Stress : Only a little  Social Connections: Moderately Isolated (12/10/2023)   Social Connection and Isolation Panel [NHANES]    Frequency of Communication with Friends and Family: More than three times a week    Frequency of Social Gatherings with Friends and Family: Three times a week    Attends Religious Services: More than 4 times per year    Active Member of Clubs or Organizations: No    Attends Banker Meetings: Never    Marital Status: Divorced  Tobacco Counseling Counseling given: Not Answered   Clinical Intake:  Pre-visit preparation completed: Yes  Pain : No/denies pain     Diabetes: No  How often do you need to have someone help you when you read instructions, pamphlets, or other written materials from your doctor or pharmacy?: 1 - Never  Interpreter Needed?: No  Information entered by :: Kandis Fantasia LPN   Activities of Daily Living    12/10/2023   11:57 AM  In your present state of health, do you have any difficulty performing the following activities:  Hearing? 0  Vision? 0  Difficulty concentrating or making decisions? 0  Walking or climbing stairs? 0  Dressing or bathing? 0  Doing errands, shopping? 0  Preparing Food and eating ? N  Using the Toilet? N  In the past six months, have you accidently leaked urine? N  Do you have problems with loss of bowel control? N  Managing your Medications? N  Managing your Finances? N  Housekeeping or managing your Housekeeping? N    Patient Care Team: Raliegh Ip, DO as PCP - General (Family Medicine) Rollene Rotunda, MD as Consulting Physician (Cardiology)  Indicate any recent Medical Services you may have received from other than [facility] providers in the past year (date may be approximate).     Assessment:   This is a routine wellness examination for [facility].  Hearing/Vision screen Hearing Screening - Comments:: Denies hearing  difficulties   Vision Screening - Comments:: up to date with routine eye exams with [facility] [place]     Goals Addressed   None   Depression Screen    12/10/2023   11:56 AM 12/05/2023    9:54 AM 12/05/2023    9:52 AM 07/11/2023    2:48 PM 12/07/2022    9:10 AM 09/05/2022    8:46 AM 03/30/2022   10:57 AM  PHQ 2/9 Scores  PHQ - 2 Score 0 0 0 0 0 0 0  PHQ- 9 Score 0 0  0  0     Fall Risk    12/10/2023   11:57 AM 12/07/2022    9:09 AM 09/05/2022    8:46 AM 03/30/2022   10:57 AM 02/06/2022   10:14 AM  Fall Risk   Falls in the past year? 0 0 0 0 0  Number falls in past yr: 0 0     Injury with Fall? 0 0     Risk for fall due to : No Fall Risks No Fall Risks     Follow up Falls prevention discussed;Education provided;Falls evaluation completed Falls prevention discussed       MEDICARE RISK AT HOME: Medicare Risk at Home Any stairs in or around the home?: No If so, are there any without handrails?: No Home free of loose throw rugs in walkways, pet beds, electrical cords, etc?: Yes Adequate lighting in your home to reduce risk of falls?: Yes Life alert?: No Use of a cane, walker or w/c?: No Grab bars in the bathroom?: Yes Shower chair or bench in shower?: No Elevated toilet seat or a handicapped toilet?: Yes  TIMED UP AND GO:  Was the test performed?  No    Cognitive Function:    08/13/2017   11:27 AM  MMSE - Mini Mental State Exam  Orientation to time 5  Orientation to Place 5  Registration 3  Attention/ Calculation 5  Recall 2  Language- name 2 objects 2  Language- repeat 1  Language- follow 3 step  command 3  Language- read & follow direction 1  Write a sentence 1  Copy design 1  Total score 29        12/10/2023   11:58 AM 12/07/2022    9:11 AM 12/05/2021    8:47 AM 12/05/2021    8:29 AM 03/24/2019    3:38 PM  6CIT Screen  What Year? 0 points 0 points 0 points 0 points 0 points  What month? 0 points 0 points 0 points 0 points 0 points  What time? 0 points 0 points 0  points 0 points 0 points  Count back from 20 0 points 0 points 0 points 0 points 0 points  Months in reverse 0 points 0 points 0 points 0 points 0 points  Repeat phrase 0 points 0 points 0 points 0 points 0 points  Total Score 0 points 0 points 0 points 0 points 0 points    Immunizations Immunization History  Administered Date(s) Administered   Fluad Quad(high Dose 65+) 07/09/2019   Hepatitis B, ADULT 06/25/2009, 11/11/2009   Influenza, High Dose Seasonal PF 08/01/2017, 08/05/2018   Influenza-Unspecified 07/27/2020, 07/05/2021   Moderna Sars-Covid-2 Vaccination 02/04/2020, 03/03/2020   PFIZER(Purple Top)SARS-COV-2 Vaccination 09/13/2020   Pfizer Covid-19 Vaccine Bivalent Booster 46yrs & up 10/15/2021   Pneumococcal Conjugate-13 10/05/2016   Pneumococcal Polysaccharide-23 10/08/2017   Tdap 01/31/2010, 02/02/2020   Zoster Recombinant(Shingrix) 12/05/2023   Zoster, Live 10/05/2016    TDAP status: Up to date  Flu Vaccine status: Up to date  Pneumococcal vaccine status: Up to date  Covid-19 vaccine status: Information provided on how to obtain vaccines.   Qualifies for Shingles Vaccine? Yes   Zostavax completed Yes   Shingrix Completed?: No.    Education has been provided regarding the importance of this vaccine. Patient has been advised to call insurance company to determine out of pocket expense if they have not yet received this vaccine. Advised may also receive vaccine at local pharmacy or Health Dept. Verbalized acceptance and understanding.  Screening Tests Health Maintenance  Topic Date Due   COVID-19 Vaccine (5 - 2024-25 season) 07/08/2023   INFLUENZA VACCINE  02/04/2024 (Originally 06/07/2023)   Zoster Vaccines- Shingrix (2 of 2) 01/30/2024   MAMMOGRAM  05/24/2024   Medicare Annual Wellness (AWV)  12/09/2024   DEXA SCAN  07/10/2025   DTaP/Tdap/Td (3 - Td or Tdap) 02/01/2030   Colonoscopy  10/08/2033   Pneumonia Vaccine 30+ Years old  Completed   Hepatitis C Screening   Completed   HPV VACCINES  Aged Out    Health Maintenance  Health Maintenance Due  Topic Date Due   COVID-19 Vaccine (5 - 2024-25 season) 07/08/2023    Colorectal cancer screening: Type of screening: Colonoscopy. Completed 10/09/23. Repeat every 10 years  Mammogram status: Completed 05/25/23. Repeat every year  Bone Density status: Completed 07/11/23. Results reflect: Bone density results: NORMAL. Repeat every 5 years.  Lung Cancer Screening: (Low Dose CT Chest recommended if Age 32-80 years, 20 pack-year currently smoking OR have quit w/in 15years.) does not qualify.   Lung Cancer Screening Referral: n/a  Additional Screening:  Hepatitis C Screening: does qualify; Completed 10/05/16  Vision Screening: Recommended annual ophthalmology exams for early detection of glaucoma and other disorders of the eye. Is the patient up to date with their annual eye exam?  Yes  Who is the provider or what is the name of the office in which the patient attends annual eye exams? [facility] If pt is not established with  a provider, would they like to be referred to a provider to establish care? No .   Dental Screening: Recommended annual dental exams for proper oral hygiene  Community Resource Referral / Chronic Care Management: CRR required this visit?  No   CCM required this visit?  No     Plan:     I have personally reviewed and noted the following in the patient's chart:   Medical and social history Use of alcohol, tobacco or illicit drugs  Current medications and supplements including opioid prescriptions. Patient is not currently taking opioid prescriptions. Functional ability and status Nutritional status Physical activity Advanced directives List of other physicians Hospitalizations, surgeries, and ER visits in previous 12 months Vitals Screenings to include cognitive, depression, and falls Referrals and appointments  In addition, I have reviewed and discussed with  patient certain preventive protocols, quality metrics, and best practice recommendations. A written personalized care plan for preventive services as well as general preventive health recommendations were provided to patient.     Kandis Fantasia [place], [place]   12/16/5619   After Visit Summary: (MyChart) Due to this being a telephonic visit, the after visit summary with patients personalized plan was offered to patient via MyChart   Nurse Notes: No concerns at this time

## 2023-12-17 MED ORDER — VALACYCLOVIR HCL 500 MG PO TABS: 500.0000 mg | ORAL_TABLET | Freq: Every day | ORAL | 3 refills | Status: AC

## 2024-01-14 ENCOUNTER — Ambulatory Visit (INDEPENDENT_AMBULATORY_CARE_PROVIDER_SITE_OTHER): Admitting: Family Medicine

## 2024-01-14 ENCOUNTER — Encounter: Payer: Self-pay | Admitting: Family Medicine

## 2024-01-14 VITALS — BP 148/76 | HR 80 | Temp 97.9°F | Ht 66.0 in | Wt 190.0 lb

## 2024-01-14 DIAGNOSIS — M79672 Pain in left foot: Secondary | ICD-10-CM

## 2024-01-14 DIAGNOSIS — M722 Plantar fascial fibromatosis: Secondary | ICD-10-CM | POA: Diagnosis not present

## 2024-01-14 DIAGNOSIS — B351 Tinea unguium: Secondary | ICD-10-CM | POA: Diagnosis not present

## 2024-01-14 MED ORDER — CICLOPIROX 8 % EX SOLN: Freq: Every day | CUTANEOUS | 1 refills | Status: DC

## 2024-01-14 NOTE — Progress Notes (Signed)
 Subjective: CC: Left foot pain PCP: Raliegh Ip, DO [id number] Anita Kennedy is a 73 y.o. female presenting to clinic today for:  1.  Left foot pain Patient reports that she injured her left arch when she was hiking over the weekend with her new boyfriend Tim.  She notes that it was bruised and swollen at 1 point.  Denies any preceding injury but notes that she had been wearing a pair of orthotics that moved out of place that she had not worn in quite some time.  She still having some difficulty ambulating on that foot due to discomfort.  Denies bony pain.  She points to the plantar aspect of the left foot as a source of discomfort.  She of course has not worn the orthotics since that time.  2.  Nail fungus She reports recurrence of nail fungus and wants to know what she can do for this.   ROS: Per HPI  Allergies  Allergen Reactions   Penicillins Hives   Nickel Rash   Past Medical History:  Diagnosis Date   Arthritis    Hyperlipidemia    Hypertension    Kidney stones    Plantar fasciitis of right foot     Current Outpatient Medications:    acetaminophen (TYLENOL) 650 MG CR tablet, Take 1,300 mg by mouth every 8 (eight) hours as needed for pain., Disp: , Rfl:    aspirin EC 81 MG tablet, Take 81 mg by mouth daily. Swallow whole., Disp: , Rfl:    Cholecalciferol (VITAMIN D3) 125 MCG (5000 UT) CAPS, Take by mouth daily., Disp: , Rfl:    co-enzyme Q-10 30 MG capsule, Take 30 mg by mouth daily., Disp: , Rfl:    Ginger, Zingiber officinalis, (GINGER ROOT) 550 MG CAPS, Take by mouth daily., Disp: , Rfl:    L-Lysine 1000 MG TABS, Take by mouth daily., Disp: , Rfl:    lisinopril (ZESTRIL) 20 MG tablet, Take 1 tablet (20 mg total) by mouth daily., Disp: 90 tablet, Rfl: 3   Multiple Vitamin (MULTIVITAMIN) tablet, Take 1 tablet by mouth daily., Disp: , Rfl:    Multiple Vitamins-Minerals (CENTRUM ADULT 50+ MULTIGUMMIES PO), Take by mouth daily., Disp: , Rfl:    Omega-3 1000 MG CAPS,  daily., Disp: , Rfl:    rosuvastatin (CRESTOR) 10 MG tablet, Take 1 tablet (10 mg total) by mouth daily. Dose change, Disp: 90 tablet, Rfl: 3   Turmeric 400 MG CAPS, daily., Disp: , Rfl:    Turmeric 03-999 MG CAPS, Take 2 capsules by mouth daily., Disp: , Rfl:    valACYclovir (VALTREX) 500 MG tablet, Take 1 tablet (500 mg total) by mouth daily., Disp: 90 tablet, Rfl: 3 Social History   Socioeconomic History   Marital status: Divorced    Spouse name: Not on file   Number of children: 9   Years of education: some college   Highest education level: 12th grade  Occupational History   Occupation: Retired  Tobacco Use   Smoking status: Former    Current packs/day: 0.00    Types: Cigarettes    Quit date: 10/05/2013    Years since quitting: 10.2   Smokeless tobacco: Never  Vaping Use   Vaping status: Never Used  Substance and Sexual Activity   Alcohol use: No   Drug use: No   Sexual activity: Not Currently    Birth control/protection: Post-menopausal  Other Topics Concern   Not on file  Social History Narrative   6 children  living.   9 grandchildren and 2 great grandchildren.   Social Drivers of Corporate investment banker Strain: Low Risk  (12/10/2023)   Overall Financial Resource Strain (CARDIA)    Difficulty of Paying Living Expenses: Not very hard  Recent Concern: Financial Resource Strain - Medium Risk (12/05/2023)   Overall Financial Resource Strain (CARDIA)    Difficulty of Paying Living Expenses: Somewhat hard  Food Insecurity: No Food Insecurity (12/10/2023)   Hunger Vital Sign    Worried About Running Out of Food in the Last Year: Never true    Ran Out of Food in the Last Year: Never true  Transportation Needs: No Transportation Needs (12/10/2023)   PRAPARE - Administrator, Civil Service (Medical): No    Lack of Transportation (Non-Medical): No  Physical Activity: Sufficiently Active (12/10/2023)   Exercise Vital Sign    Days of Exercise per Week: 6 days     Minutes of Exercise per Session: 60 min  Stress: No Stress Concern Present (12/10/2023)   [employer] of Occupational Health - Occupational Stress Questionnaire    Feeling of Stress : Only a little  Social Connections: Moderately Isolated (12/10/2023)   Social Connection and Isolation Panel [NHANES]    Frequency of Communication with Friends and Family: More than three times a week    Frequency of Social Gatherings with Friends and Family: Three times a week    Attends Religious Services: More than 4 times per year    Active Member of Clubs or Organizations: No    Attends Banker Meetings: Never    Marital Status: Divorced  Catering manager Violence: Not At Risk (12/10/2023)   Humiliation, Afraid, Rape, and Kick questionnaire    Fear of Current or Ex-Partner: No    Emotionally Abused: No    Physically Abused: No    Sexually Abused: No   Family History  Problem Relation Age of Onset   Heart disease Mother        Irregular heart beat   Alcohol abuse Father    Early death Father        Died of MI age 18   Heart disease Father 61   Hyperlipidemia Father    Hypertension Father    Cancer Maternal Grandmother    Depression Maternal Grandmother    Breast cancer Maternal Grandmother        unsure of age    Colon cancer Neg Hx     Objective: Office vital signs reviewed. BP (!) 148/76   Pulse 80   Temp 97.9 F (36.6 C)   Ht 5\' 6"  (1.676 m)   Wt 190 lb (86.2 kg)   SpO2 96%   BMI 30.67 kg/m   Physical Examination:  General: Awake, alert, well nourished, No acute distress HEENT: sclera white, MMM MSK: Gait is antalgic.  She has tenderness palpation along the plantar fascia of the left foot.  There is no gross discoloration or swelling appreciated.  No tenderness palpation along the shafts of the tarsals or along metatarsals Skin: Some onychomycotic changes noted to the nails of the toes bilaterally  Assessment/ Plan: 73 y.o. female   Foot arch pain,  left  Plantar fasciitis, left  Onychomycosis - Plan: ciclopirox (PENLAC) 8 % solution  Suspect that she has got plantar fascia irritation.  She had no tenderness palpation along the bones of the foot and I did not appreciate any gross swelling or discoloration.  I placed her in a postop shoe  and instructed her to use ice to the effected area, oral analgesics as needed.  Would like her to use the postop shoe for the next 7 to 10 days.  She may come out of it as tolerated.  She may consider eval with podiatry if symptoms are not improving and/or if they get worse.  Additionally, for the nail fungus I sent over Penlac but did give her option for Lamisil should she choose going forward.  She will follow-up as needed on these issues   Raliegh Ip, DO [facility] [phone]

## 2024-01-14 NOTE — Patient Instructions (Signed)
 Treat as you would a plantar fasciitis Ice/ compression/ over the counter analgesics You can try coming out of the shoe in about 10 days. If not improving, let me know and I will get you to a foot doctor. Penlac solution sent for the nails. Use as directed.

## 2024-02-12 NOTE — Progress Notes (Unsigned)
  Cardiology Office Note:   Date:  02/13/2024  ID:  Anita Kennedy, DOB March 03, 1951, MRN [id number] PCP: Raliegh Ip, DO  [facility] Providers Cardiologist:  None {  History of Present Illness:   Anita Kennedy is a 73 y.o. female who presents for evaluation of chest pain.  She had CTA with mild to moderate non obstructive disease.  It was listed as moderate to severe in the LAD but FFR was not suggestive of obstructive CAD.  She has not been seen since 2023.      Since I last saw her she has had no new complaints.  The patient denies any new symptoms such as chest discomfort, neck or arm discomfort. There has been no new shortness of breath, PND or orthopnea. There have been no reported palpitations, presyncope or syncope.   She was walking 3 to 4 miles a day but now has developed some bone spurs and plantar fasciitis.  She is getting management of this.  She is going to start working out with her [employer]  ROS: As stated in the HPI and negative for all other systems.  Studies Reviewed:    EKG:   EKG Interpretation Date/Time:  Wednesday February 13 2024 13:27:06 EDT Ventricular Rate:  79 PR Interval:  148 QRS Duration:  84 QT Interval:  390 QTC Calculation: 447 R Axis:   73  Text Interpretation: Sinus rhythm with sinus arrhythmia with occasional Premature ventricular complexes Nonspecific ST abnormality  since previous EKG PVCs are new. Confirmed by Rollene Rotunda ([id number]) on 02/13/2024 1:51:06 PM    Risk Assessment/Calculations:           Physical Exam:   VS:  BP (!) 150/80   Pulse 73   Ht 5\' 5"  (1.651 m)   Wt 188 lb (85.3 kg)   BMI 31.28 kg/m    Wt Readings from Last 3 Encounters:  02/13/24 188 lb (85.3 kg)  01/14/24 190 lb (86.2 kg)  12/10/23 184 lb (83.5 kg)     GEN: Well nourished, well developed in no acute distress NECK: No JVD; No carotid bruits CARDIAC: RRR, no murmurs, rubs, gallops RESPIRATORY:  Clear to auscultation without rales, wheezing or  rhonchi  ABDOMEN: Soft, non-tender, non-distended EXTREMITIES:  No edema; No deformity   ASSESSMENT AND PLAN:   CAD:  The patient has no new sypmtoms.  No further cardiovascular testing is indicated.  We will continue with aggressive risk reduction and meds as listed.   AORTIC ATHEROSCLEROSIS: She will be managed with primary risk reduction as above and below.   DYSLIPIDEMIA: Her LDL was 61 with an HDL of 67.  No change in therapy.DL less than 70.      Follow up with me in one year.   Signed, Rollene Rotunda, MD

## 2024-02-13 ENCOUNTER — Encounter (INDEPENDENT_AMBULATORY_CARE_PROVIDER_SITE_OTHER): Payer: Self-pay | Admitting: Family Medicine

## 2024-02-13 ENCOUNTER — Encounter: Payer: Self-pay | Admitting: Cardiology

## 2024-02-13 ENCOUNTER — Ambulatory Visit (INDEPENDENT_AMBULATORY_CARE_PROVIDER_SITE_OTHER): Payer: Medicare PPO | Admitting: Cardiology

## 2024-02-13 ENCOUNTER — Ambulatory Visit (INDEPENDENT_AMBULATORY_CARE_PROVIDER_SITE_OTHER)

## 2024-02-13 ENCOUNTER — Encounter: Payer: Self-pay | Admitting: Family Medicine

## 2024-02-13 VITALS — BP 150/80 | HR 73 | Ht 65.0 in | Wt 188.0 lb

## 2024-02-13 DIAGNOSIS — M722 Plantar fascial fibromatosis: Secondary | ICD-10-CM

## 2024-02-13 DIAGNOSIS — M2042 Other hammer toe(s) (acquired), left foot: Secondary | ICD-10-CM | POA: Diagnosis not present

## 2024-02-13 DIAGNOSIS — E785 Hyperlipidemia, unspecified: Secondary | ICD-10-CM | POA: Diagnosis not present

## 2024-02-13 DIAGNOSIS — M7732 Calcaneal spur, left foot: Secondary | ICD-10-CM | POA: Diagnosis not present

## 2024-02-13 DIAGNOSIS — I700 Atherosclerosis of aorta: Secondary | ICD-10-CM | POA: Diagnosis not present

## 2024-02-13 DIAGNOSIS — M79672 Pain in left foot: Secondary | ICD-10-CM | POA: Diagnosis not present

## 2024-02-13 DIAGNOSIS — I2510 Atherosclerotic heart disease of native coronary artery without angina pectoris: Secondary | ICD-10-CM | POA: Diagnosis not present

## 2024-02-13 DIAGNOSIS — M7662 Achilles tendinitis, left leg: Secondary | ICD-10-CM | POA: Diagnosis not present

## 2024-02-13 DIAGNOSIS — M2012 Hallux valgus (acquired), left foot: Secondary | ICD-10-CM | POA: Diagnosis not present

## 2024-02-13 NOTE — Patient Instructions (Signed)
 Medication Instructions:  The current medical regimen is effective;  continue present plan and medications.  *If you need a refill on your cardiac medications before your next appointment, please call your pharmacy*  Follow-Up: At [facility], you and your health needs are our priority.  As part of our continuing mission to provide you with exceptional heart care, our providers are all part of one team.  This team includes your primary Cardiologist (physician) and Advanced Practice Providers or APPs (Physician Assistants and Nurse Practitioners) who all work together to provide you with the care you need, when you need it.  Your next appointment:   1 year(s)  Provider:   Rollene Rotunda, MD    We recommend signing up for the patient portal called "MyChart".  Sign up information is provided on this After Visit Summary.  MyChart is used to connect with patients for Virtual Visits (Telemedicine).  Patients are able to view lab/test results, encounter notes, upcoming appointments, etc.  Non-urgent messages can be sent to your provider as well.   To learn more about what you can do with MyChart, go to [email or web address].

## 2024-02-13 NOTE — Telephone Encounter (Signed)

## 2024-02-15 ENCOUNTER — Encounter: Payer: Self-pay | Admitting: Podiatry

## 2024-02-15 ENCOUNTER — Ambulatory Visit: Admitting: Podiatry

## 2024-02-15 ENCOUNTER — Ambulatory Visit (INDEPENDENT_AMBULATORY_CARE_PROVIDER_SITE_OTHER)

## 2024-02-15 VITALS — Ht 65.0 in | Wt 188.0 lb

## 2024-02-15 DIAGNOSIS — L600 Ingrowing nail: Secondary | ICD-10-CM | POA: Diagnosis not present

## 2024-02-15 DIAGNOSIS — M79672 Pain in left foot: Secondary | ICD-10-CM

## 2024-02-15 DIAGNOSIS — M722 Plantar fascial fibromatosis: Secondary | ICD-10-CM

## 2024-02-15 MED ORDER — DICLOFENAC SODIUM 75 MG PO TBEC: 75.0000 mg | DELAYED_RELEASE_TABLET | Freq: Two times a day (BID) | ORAL | 2 refills | Status: AC

## 2024-02-15 MED ORDER — TRIAMCINOLONE ACETONIDE 10 MG/ML IJ SUSP
10.0000 mg | Freq: Once | INTRAMUSCULAR | Status: AC
  Administered 2024-02-15: 10 mg via INTRA_ARTICULAR

## 2024-02-18 NOTE — Progress Notes (Signed)
 Subjective:   Patient ID: Anita Kennedy, female   DOB: 73 y.o.   MRN: [id number]   HPI Patient presents with intense discomfort plantar aspect left heel and states she cannot even put her foot on the ground.  States that it has been so sore it feels like the bone is broken and it has been going on for a relative short period of time but a number of weeks.  Patient does not remember injury does not smoke likes to be active   Review of Systems  All other systems reviewed and are negative.       Objective:  Physical Exam Vitals and nursing note reviewed.  Constitutional:      Appearance: She is well-developed.  Pulmonary:     Effort: Pulmonary effort is normal.  Musculoskeletal:        General: Normal range of motion.  Skin:    General: Skin is warm.  Neurological:     Mental Status: She is alert.     Neurovascular status intact muscle strength found to be adequate range of motion adequate with exquisite discomfort in the plantar aspect of the left heel mild discomfort within the heel bone itself but pain plantar much more intense.  Patient is found to have good digital perfusion well oriented x 3     Assessment:  Acute plantar fasciitis left intense in nature inability to walk on the plantar foot cannot rule out stress fracture due to the intensity of discomfort     Plan:  H&P reviewed conditions and discussed at great length.  At this point I have recommended aggressive conservative approach due to the intensity of discomfort and I went ahead and reviewed x-ray then did sterile prep injected the plantar fascia at insertion 3 mg Kenalog 5 mg Xylocaine applied sterile dressing.  I then applied air fracture walker properly fitted to the lower leg to completely immobilize and take all plantar pressure off the foot.  Patient to be seen back in approximate 3 weeks or earlier if needed  X-rays indicate small spur but I did not see at this point any indication of stress fracture  calcaneus

## 2024-03-07 ENCOUNTER — Other Ambulatory Visit: Payer: Self-pay | Admitting: *Deleted

## 2024-03-07 DIAGNOSIS — B351 Tinea unguium: Secondary | ICD-10-CM

## 2024-03-07 MED ORDER — CICLOPIROX 8 % EX SOLN: Freq: Every day | CUTANEOUS | 0 refills | Status: AC

## 2024-03-12 ENCOUNTER — Encounter: Payer: Self-pay | Admitting: Podiatrist

## 2024-03-12 ENCOUNTER — Ambulatory Visit (INDEPENDENT_AMBULATORY_CARE_PROVIDER_SITE_OTHER): Admitting: Podiatrist

## 2024-03-12 DIAGNOSIS — M722 Plantar fascial fibromatosis: Secondary | ICD-10-CM | POA: Diagnosis not present

## 2024-03-12 NOTE — Progress Notes (Signed)
   HPI: Patient is 73 y.o. female who presents today for follow up of plantar fasciitis-  she saw Dr. Celia Coles and he gave her an injection to which she states she is 65% better.  She also has a torn off toenail= left third. Relates her sons bulldog stepped on her toe just so and the nail was ripped off the toe.      Allergies  Allergen Reactions   Penicillins Hives   Nickel Rash    Review of systems is negative except as noted in the HPI.  Denies nausea/ vomiting/ fevers/ chills or night sweats.   Denies difficulty breathing, denies calf pain or tenderness  Physical Exam  Patient is awake, alert, and oriented x 3.  In no acute distress.    Vascular status is intact with palpable pedal pulses DP and PT bilateral and capillary refill time less than 3 seconds bilateral.  No edema or erythema noted.   Neurological exam reveals epicritic and protective sensation grossly intact bilateral.   Dermatological exam reveals skin is supple and dry to bilateral feet. Left third toenail has been completely avulsed.  No redness, swelling or sign of infection noted.  Granular nail bed is present.  Healing well.   Musculoskeletal exam: tenderness to palpation plantar aspect of the left heel at the insertion of the plantar fascia.  No pain with medial to lateral compression of the heel. No pain with tuning fork on the heel.  General improvement is subjectively reported.      Assessment:   ICD-10-CM   1. Plantar fasciitis of left foot  M72.2        Plan: Discussed that because the heel is improved by the injection, it is most likely plantar fasciitis.  She agreeed and requested a second injection.  This was carried out today as stated below.  I recommended stretching and icing and good supportive shoegear.  She will call if the injection fails to relieve her pain-  otherwise will call in the future if needed.    Procedure: Injection plantar fascia left heel Discussed alternatives, risks, complications  and verbal consent was obtained.  Skin Prep: Alcohol. Injectate: 1cc 0.5% marcaine  plain, 1 cc 10 mg Kenalog  Disposition: Patient tolerated procedure well. Injection site dressed with a band-aid.  Post-injection care was discussed and return precautions discussed.

## 2024-03-12 NOTE — Patient Instructions (Signed)
 Exercises for Plantar Fasciitis Foot and leg exercises can help if you have plantar fasciitis. Only do the exercises you were told to do. Make sure you know how to do the exercises safely. Follow the steps below. It's normal to feel mild discomfort. Stop if you feel pain or your pain gets worse. Do not start these exercises until told by your health care provider. Stretching and range-of-motion exercises These exercises warm up your muscles and joints. They also help with movement and flexibility of your foot. They can help with pain. Plantar fascia stretch This exercise will stretch your plantar fascia, which is a band of thick tissue on the bottom of your foot. Sit with your left / right leg crossed over your other knee. Hold your heel with one hand with that thumb near your arch. With your other hand, hold your toes. Gently pull your toes back toward the top of your foot. You should feel a stretch on the bottom of your toes, on the bottom of your foot, or both. Hold this stretch for ____30______ seconds. Slowly let go of your toes. Go back to the starting position. Repeat ____10______ times. Do this exercise ___3_______ times a day. Gastroc stretch, standing This exercise is called an upper calf, or gastroc, stretch. It stretches the muscles in the back of your upper calf. Stand with your hands against a wall. Extend your left / right leg behind you. Bend your front knee just a little. Keep your heels on the floor, your toes facing forward, and your back knee straight. Shift your weight toward the wall. Do not arch your back. You should feel a gentle stretch in your upper calf. Hold this position for __________ seconds. Repeat __________ times. Do this exercise __________ times a day. Soleus stretch, standing This exercise is called a lower calf, or soleus, stretch. It stretches the muscles in the back of your lower calf. Stand with your hands against a wall. Extend your left / right leg  behind you, and bend your front knee slightly. Keep your heels on the floor and your toes facing forward. Bend your back knee and shift your weight slightly over your back leg. You should feel a gentle stretch deep in your lower calf. Hold this position for __________ seconds. Repeat __________ times. Do this exercise __________ times a day. Gastroc and soleus stretch, standing step This exercise stretches the muscles in the back of your lower leg. This includes your gastroc and soleus muscles. Stand with the ball of your left / right foot on the front of a step. The ball of your foot is on the walking surface, right under your toes. Keep your other foot firmly on the same step. Hold on to the wall or a railing for balance. Slowly lift your other foot, letting your body weight press your heel down over the edge of the front of the step. Keep your knee straight and unbent. You should feel a stretch in your calf. Hold this position for __________ seconds. Return both feet to the step. Repeat this exercise with a slight bend in your left / right knee. Repeat __________ times with your left / right knee straight and __________ times with your left / right knee bent. Do this exercise __________ times a day. Balance exercise This exercise builds your balance and strength control of your arch. It helps take pressure off your plantar fascia. Single leg stand If this exercise is too easy, you can try it with your eyes closed  or while standing on a pillow. Without shoes, stand near a railing or in a doorway. You may hold on to the railing or doorway as needed. Stand on your left / right foot. Keep your big toe down on the floor. Lift the arch of your foot. You should feel a stretch across the bottom of your foot and arch. Do not let your foot roll inward. Hold this position for __________ seconds. Repeat __________ times. Do this exercise __________ times a day. This information is not intended to  replace advice given to you by your health care provider. Make sure you discuss any questions you have with your health care provider. Document Revised: 03/26/2023 Document Reviewed: 03/26/2023 Elsevier Patient Education  2024 [employer].

## 2024-04-03 ENCOUNTER — Ambulatory Visit (INDEPENDENT_AMBULATORY_CARE_PROVIDER_SITE_OTHER): Payer: Medicare PPO

## 2024-04-03 DIAGNOSIS — Z23 Encounter for immunization: Secondary | ICD-10-CM

## 2024-04-03 NOTE — Progress Notes (Signed)
 Patient is in office today for a nurse visit for Immunization. Patient Injection was given in the  Right deltoid. Patient tolerated injection well.

## 2024-04-09 ENCOUNTER — Encounter: Payer: Self-pay | Admitting: Cardiology

## 2024-06-05 ENCOUNTER — Other Ambulatory Visit: Payer: Self-pay | Admitting: Family Medicine

## 2024-06-05 DIAGNOSIS — Z1231 Encounter for screening mammogram for malignant neoplasm of breast: Secondary | ICD-10-CM

## 2024-06-24 ENCOUNTER — Ambulatory Visit
Admission: RE | Admit: 2024-06-24 | Discharge: 2024-06-24 | Disposition: A | Discharge status: Home / Self Care | Source: Ambulatory Visit | Attending: Family Medicine | Admitting: Family Medicine

## 2024-06-24 DIAGNOSIS — Z1231 Encounter for screening mammogram for malignant neoplasm of breast: Secondary | ICD-10-CM | POA: Diagnosis not present

## 2024-07-23 ENCOUNTER — Encounter: Payer: Self-pay | Admitting: Cardiology

## 2024-07-27 ENCOUNTER — Ambulatory Visit: Payer: Self-pay | Admitting: Cardiology

## 2024-08-04 ENCOUNTER — Ambulatory Visit: Admitting: Physician Assistant

## 2024-08-06 ENCOUNTER — Other Ambulatory Visit: Payer: Self-pay | Admitting: Radiology

## 2024-08-06 ENCOUNTER — Other Ambulatory Visit (INDEPENDENT_AMBULATORY_CARE_PROVIDER_SITE_OTHER): Payer: Self-pay

## 2024-08-06 ENCOUNTER — Ambulatory Visit: Admitting: Physician Assistant

## 2024-08-06 DIAGNOSIS — M1711 Unilateral primary osteoarthritis, right knee: Secondary | ICD-10-CM

## 2024-08-06 DIAGNOSIS — G8929 Other chronic pain: Secondary | ICD-10-CM | POA: Diagnosis not present

## 2024-08-06 DIAGNOSIS — M170 Bilateral primary osteoarthritis of knee: Secondary | ICD-10-CM

## 2024-08-06 DIAGNOSIS — M1712 Unilateral primary osteoarthritis, left knee: Secondary | ICD-10-CM

## 2024-08-06 DIAGNOSIS — M25562 Pain in left knee: Secondary | ICD-10-CM

## 2024-08-06 MED ORDER — METHYLPREDNISOLONE ACETATE 40 MG/ML IJ SUSP
40.0000 mg | INTRAMUSCULAR | Status: AC | PRN
  Administered 2024-08-06: 40 mg via INTRA_ARTICULAR

## 2024-08-06 MED ORDER — LIDOCAINE HCL 1 % IJ SOLN
3.0000 mL | INTRAMUSCULAR | Status: AC | PRN
  Administered 2024-08-06: 3 mL

## 2024-08-06 NOTE — Progress Notes (Signed)
 Office Visit Note   Patient: Anita Kennedy           Date of Birth: October 11, 1951           MRN: [id number] Visit Date: 08/06/2024              Requested by: Jolinda Norene HERO, DO [address] [place],  [place] [address] PCP: Jolinda Norene HERO, DO   Assessment & Plan: Visit Diagnoses:  1. Primary osteoarthritis of right knee   2. Primary osteoarthritis of left knee     Plan: Recommend she continue to work on quad strengthening.  She will follow-up with us  as needed knows to wait at least 3 months between cortisone injections.  She also ask about viscosupplementation injections she is not ready for this but would like to know what her out of pocket cost would be.  Will have April call her with this information.  Questions were encouraged and answered.  Follow-up as needed.  Follow-Up Instructions: Return if symptoms worsen or fail to improve.   Orders:  Orders Placed This Encounter  Procedures   Large Joint Inj: bilateral knee   XR Knee 1-2 Views Right   XR Knee 1-2 Views Left   No orders of the defined types were placed in this encounter.     Procedures: Large Joint Inj: bilateral knee on 08/06/2024 11:40 AM Indications: pain Details: 22 G 1.5 in needle, anterolateral approach  Arthrogram: No  Medications (Right): 3 mL lidocaine  1 %; 40 mg methylPREDNISolone  acetate 40 MG/ML Medications (Left): 3 mL lidocaine  1 %; 40 mg methylPREDNISolone  acetate 40 MG/ML Outcome: tolerated well, no immediate complications Procedure, treatment alternatives, risks and benefits explained, specific risks discussed. Consent was given by the patient. Immediately prior to procedure a time out was called to verify the correct patient, procedure, equipment, support staff and site/side marked as required. Patient was prepped and draped in the usual sterile fashion.       Clinical Data: No additional findings.   Subjective: Chief Complaint  Patient presents with   Right Knee - Pain    Left Knee - Pain    HPI Anita Kennedy comes in today requesting bilateral knee injections.  She has chronic pain in both knees known osteoarthritis both knees.  She last had injections 12-24 and this gave her good results in the right knee until recently.  Denies any new injuries.  Denies any fevers chills.  Nondiabetic.  She finds Ace bandages beneficial but comes in today with open patella knee braces which she finds cooler with the warmer weather that were having as of now.  Review of Systems  Constitutional:  Negative for chills and fever.     Objective: Vital Signs: There were no vitals taken for this visit.  Physical Exam Constitutional:      Appearance: She is not ill-appearing or diaphoretic.  Pulmonary:     Effort: Pulmonary effort is normal.  Neurological:     Mental Status: She is alert.  Psychiatric:        Mood and Affect: Mood normal.     Ortho Exam Physical exam: Bilateral knees good range of motion.  No abnormal warmth erythema or effusion of either knee.  Specialty Comments:  No specialty comments available.  Imaging: XR Knee 1-2 Views Right Result Date: 08/06/2024 Right knee 2 views: No acute fractures.  Knee is well located.  Bone-on-bone medial compartment.  Mild to moderate lateral compartmental arthritic changes with periarticular spurring.  Severe  patellofemoral arthritic changes.  No acute findings otherwise.  XR Knee 1-2 Views Left Result Date: 08/06/2024 Left knee 2 views: No acute fractures.  Knee is well located.  Moderate to moderately severe medial lateral compartmental arthritic narrowing with periarticular spurring.  Severe patellofemoral arthritic changes with periarticular spurring.  No acute findings.    PMFS History: Patient Active Problem List   Diagnosis Date Noted   Nodule of skin of left lower leg 12/05/2023   Coronary artery disease involving native coronary artery of native heart without angina pectoris 06/12/2022   Aortic  atherosclerosis 06/12/2022   Precordial chest pain 03/28/2022   Splenic artery aneurysm 10/24/2019   Obesity 10/08/2017   Pre-diabetes 10/08/2017   Special screening for malignant neoplasms, colon    Hyperlipidemia 10/05/2016   Past Medical History:  Diagnosis Date   Arthritis    Hyperlipidemia    Hypertension    Kidney stones    Plantar fasciitis of right foot     Family History  Problem Relation Age of Onset   Heart disease Mother        Irregular heart beat   Alcohol abuse Father    Early death Father        Died of MI age 17   Heart disease Father 52   Hyperlipidemia Father    Hypertension Father    Cancer Maternal Grandmother    Depression Maternal Grandmother    Breast cancer Maternal Grandmother        unsure of age    Colon cancer Neg Hx     Past Surgical History:  Procedure Laterality Date   BREAST CYST ASPIRATION Left    1990s   CESAREAN SECTION     CHOLECYSTECTOMY     COLONOSCOPY N/A 11/03/2016   Procedure: COLONOSCOPY;  Surgeon: Margo LITTIE Haddock, MD;  Location: [facility];  Service: Endoscopy;  Laterality: N/A;  1;00 pm   COLONOSCOPY WITH PROPOFOL  N/A 10/09/2023   Procedure: COLONOSCOPY WITH PROPOFOL ;  Surgeon: Cindie Carlin POUR, DO;  Location: [facility];  Service: Endoscopy;  Laterality: N/A;  11:45 am, asa 2   MOLE REMOVAL     POLYPECTOMY  10/09/2023   Procedure: POLYPECTOMY;  Surgeon: Cindie Carlin POUR, DO;  Location: [facility];  Service: Endoscopy;;   TONSILLECTOMY     Social History   Occupational History   Occupation: Retired  Tobacco Use   Smoking status: Former    Current packs/day: 0.00    Types: Cigarettes    Quit date: 10/05/2013    Years since quitting: 10.8   Smokeless tobacco: Never  Vaping Use   Vaping status: Never Used  Substance and Sexual Activity   Alcohol use: No   Drug use: No   Sexual activity: Not Currently    Birth control/protection: Post-menopausal

## 2024-09-08 ENCOUNTER — Encounter: Payer: Self-pay | Admitting: Radiology

## 2024-12-10 ENCOUNTER — Ambulatory Visit: Payer: Medicare PPO

## 2024-12-29 ENCOUNTER — Encounter: Payer: Medicare PPO | Admitting: Family Medicine

## 2024-12-31 ENCOUNTER — Ambulatory Visit: Admitting: Orthopaedic Surgery

## 2025-02-11 ENCOUNTER — Ambulatory Visit
